# Patient Record
Sex: Female | Born: 1940 | Race: White | Hispanic: No | Smoking: Former smoker
Health system: Southern US, Community
[De-identification: ages and names within clinical notes are randomized; demographics above are authoritative.]

## PROBLEM LIST (undated history)

## (undated) DIAGNOSIS — Z87442 Personal history of urinary calculi: Secondary | ICD-10-CM

## (undated) DIAGNOSIS — M1990 Unspecified osteoarthritis, unspecified site: Secondary | ICD-10-CM

## (undated) DIAGNOSIS — K635 Polyp of colon: Secondary | ICD-10-CM

## (undated) DIAGNOSIS — K219 Gastro-esophageal reflux disease without esophagitis: Secondary | ICD-10-CM

## (undated) DIAGNOSIS — R202 Paresthesia of skin: Secondary | ICD-10-CM

## (undated) DIAGNOSIS — E079 Disorder of thyroid, unspecified: Secondary | ICD-10-CM

## (undated) DIAGNOSIS — C801 Malignant (primary) neoplasm, unspecified: Secondary | ICD-10-CM

## (undated) DIAGNOSIS — M5030 Other cervical disc degeneration, unspecified cervical region: Secondary | ICD-10-CM

## (undated) DIAGNOSIS — T148XXA Other injury of unspecified body region, initial encounter: Secondary | ICD-10-CM

## (undated) DIAGNOSIS — E039 Hypothyroidism, unspecified: Secondary | ICD-10-CM

## (undated) DIAGNOSIS — M479 Spondylosis, unspecified: Secondary | ICD-10-CM

## (undated) DIAGNOSIS — K5730 Diverticulosis of large intestine without perforation or abscess without bleeding: Secondary | ICD-10-CM

## (undated) DIAGNOSIS — K2970 Gastritis, unspecified, without bleeding: Secondary | ICD-10-CM

## (undated) DIAGNOSIS — R609 Edema, unspecified: Secondary | ICD-10-CM

## (undated) DIAGNOSIS — K589 Irritable bowel syndrome without diarrhea: Secondary | ICD-10-CM

## (undated) HISTORY — DX: Gastro-esophageal reflux disease without esophagitis: K21.9

## (undated) HISTORY — DX: Polyp of colon: K63.5

## (undated) HISTORY — PX: CATARACT EXTRACTION, BILATERAL: SHX1313

## (undated) HISTORY — DX: Gastritis, unspecified, without bleeding: K29.70

## (undated) HISTORY — DX: Other injury of unspecified body region, initial encounter: T14.8XXA

## (undated) HISTORY — PX: FOOT SURGERY: SHX648

## (undated) HISTORY — DX: Disorder of thyroid, unspecified: E07.9

## (undated) HISTORY — DX: Unspecified osteoarthritis, unspecified site: M19.90

## (undated) HISTORY — PX: TONSILLECTOMY: SUR1361

## (undated) HISTORY — DX: Paresthesia of skin: R20.2

## (undated) HISTORY — PX: COLONOSCOPY: SHX174

## (undated) HISTORY — DX: Malignant (primary) neoplasm, unspecified: C80.1

## (undated) HISTORY — DX: Irritable bowel syndrome, unspecified: K58.9

---

## 1966-04-20 DIAGNOSIS — C801 Malignant (primary) neoplasm, unspecified: Secondary | ICD-10-CM

## 1966-04-20 HISTORY — DX: Malignant (primary) neoplasm, unspecified: C80.1

## 1966-04-20 HISTORY — PX: MELANOMA EXCISION: SHX5266

## 1977-04-20 HISTORY — PX: ABDOMINAL HYSTERECTOMY: SHX81

## 1998-09-02 ENCOUNTER — Other Ambulatory Visit: Admission: RE | Admit: 1998-09-02 | Discharge: 1998-09-02 | Payer: Self-pay | Admitting: *Deleted

## 1999-01-23 ENCOUNTER — Other Ambulatory Visit: Admission: RE | Admit: 1999-01-23 | Discharge: 1999-01-23 | Payer: Self-pay | Admitting: Gastroenterology

## 1999-09-12 ENCOUNTER — Other Ambulatory Visit: Admission: RE | Admit: 1999-09-12 | Discharge: 1999-09-12 | Payer: Self-pay | Admitting: *Deleted

## 1999-09-29 ENCOUNTER — Encounter: Payer: Self-pay | Admitting: *Deleted

## 1999-09-29 ENCOUNTER — Encounter: Admission: RE | Admit: 1999-09-29 | Discharge: 1999-09-29 | Payer: Self-pay | Admitting: *Deleted

## 1999-10-02 ENCOUNTER — Encounter: Payer: Self-pay | Admitting: *Deleted

## 1999-10-02 ENCOUNTER — Encounter: Admission: RE | Admit: 1999-10-02 | Discharge: 1999-10-02 | Payer: Self-pay | Admitting: *Deleted

## 2000-11-10 ENCOUNTER — Other Ambulatory Visit: Admission: RE | Admit: 2000-11-10 | Discharge: 2000-11-10 | Payer: Self-pay | Admitting: *Deleted

## 2000-11-17 ENCOUNTER — Encounter: Payer: Self-pay | Admitting: *Deleted

## 2000-11-17 ENCOUNTER — Encounter: Admission: RE | Admit: 2000-11-17 | Discharge: 2000-11-17 | Payer: Self-pay | Admitting: *Deleted

## 2001-11-23 ENCOUNTER — Encounter: Admission: RE | Admit: 2001-11-23 | Discharge: 2001-11-23 | Payer: Self-pay | Admitting: *Deleted

## 2001-11-23 ENCOUNTER — Encounter: Payer: Self-pay | Admitting: *Deleted

## 2002-12-13 ENCOUNTER — Other Ambulatory Visit: Admission: RE | Admit: 2002-12-13 | Discharge: 2002-12-13 | Payer: Self-pay | Admitting: Family Medicine

## 2002-12-20 ENCOUNTER — Encounter: Payer: Self-pay | Admitting: Family Medicine

## 2002-12-20 ENCOUNTER — Encounter: Admission: RE | Admit: 2002-12-20 | Discharge: 2002-12-20 | Payer: Self-pay | Admitting: Family Medicine

## 2004-02-12 ENCOUNTER — Other Ambulatory Visit: Admission: RE | Admit: 2004-02-12 | Discharge: 2004-02-12 | Payer: Self-pay | Admitting: Family Medicine

## 2004-02-13 ENCOUNTER — Ambulatory Visit (HOSPITAL_COMMUNITY): Admission: RE | Admit: 2004-02-13 | Discharge: 2004-02-13 | Payer: Self-pay | Admitting: Family Medicine

## 2005-02-12 ENCOUNTER — Other Ambulatory Visit: Admission: RE | Admit: 2005-02-12 | Discharge: 2005-02-12 | Payer: Self-pay | Admitting: Family Medicine

## 2005-03-10 ENCOUNTER — Ambulatory Visit (HOSPITAL_COMMUNITY): Admission: RE | Admit: 2005-03-10 | Discharge: 2005-03-10 | Payer: Self-pay | Admitting: Family Medicine

## 2005-04-20 HISTORY — PX: CERVICAL SPINE SURGERY: SHX589

## 2005-05-24 ENCOUNTER — Encounter: Admission: RE | Admit: 2005-05-24 | Discharge: 2005-05-24 | Payer: Self-pay | Admitting: Orthopaedic Surgery

## 2005-07-13 ENCOUNTER — Ambulatory Visit (HOSPITAL_COMMUNITY): Admission: RE | Admit: 2005-07-13 | Discharge: 2005-07-14 | Payer: Self-pay | Admitting: Neurosurgery

## 2006-03-15 ENCOUNTER — Ambulatory Visit (HOSPITAL_COMMUNITY): Admission: RE | Admit: 2006-03-15 | Discharge: 2006-03-15 | Payer: Self-pay | Admitting: Family Medicine

## 2006-03-18 ENCOUNTER — Other Ambulatory Visit: Admission: RE | Admit: 2006-03-18 | Discharge: 2006-03-18 | Payer: Self-pay | Admitting: Family Medicine

## 2006-08-03 ENCOUNTER — Ambulatory Visit: Payer: Self-pay | Admitting: Internal Medicine

## 2006-08-16 ENCOUNTER — Ambulatory Visit: Payer: Self-pay | Admitting: Internal Medicine

## 2006-09-14 ENCOUNTER — Ambulatory Visit (HOSPITAL_BASED_OUTPATIENT_CLINIC_OR_DEPARTMENT_OTHER): Admission: RE | Admit: 2006-09-14 | Discharge: 2006-09-15 | Payer: Self-pay | Admitting: Orthopedic Surgery

## 2007-03-21 ENCOUNTER — Ambulatory Visit (HOSPITAL_COMMUNITY): Admission: RE | Admit: 2007-03-21 | Discharge: 2007-03-21 | Payer: Self-pay | Admitting: Family Medicine

## 2007-03-24 ENCOUNTER — Encounter: Admission: RE | Admit: 2007-03-24 | Discharge: 2007-03-24 | Payer: Self-pay | Admitting: Family Medicine

## 2007-05-23 ENCOUNTER — Other Ambulatory Visit: Admission: RE | Admit: 2007-05-23 | Discharge: 2007-05-23 | Payer: Self-pay | Admitting: Family Medicine

## 2008-03-21 ENCOUNTER — Ambulatory Visit (HOSPITAL_COMMUNITY): Admission: RE | Admit: 2008-03-21 | Discharge: 2008-03-21 | Payer: Self-pay | Admitting: Family Medicine

## 2009-03-29 ENCOUNTER — Encounter: Admission: RE | Admit: 2009-03-29 | Discharge: 2009-03-29 | Payer: Self-pay | Admitting: Neurology

## 2009-03-29 DIAGNOSIS — M5030 Other cervical disc degeneration, unspecified cervical region: Secondary | ICD-10-CM

## 2009-03-29 DIAGNOSIS — M479 Spondylosis, unspecified: Secondary | ICD-10-CM

## 2009-03-29 HISTORY — DX: Other cervical disc degeneration, unspecified cervical region: M50.30

## 2009-03-29 HISTORY — DX: Spondylosis, unspecified: M47.9

## 2009-04-03 ENCOUNTER — Encounter: Admission: RE | Admit: 2009-04-03 | Discharge: 2009-04-03 | Payer: Self-pay | Admitting: Neurology

## 2009-04-20 HISTORY — PX: THYROIDECTOMY, PARTIAL: SHX18

## 2009-05-01 ENCOUNTER — Ambulatory Visit (HOSPITAL_COMMUNITY): Admission: RE | Admit: 2009-05-01 | Discharge: 2009-05-01 | Payer: Self-pay | Admitting: Family Medicine

## 2009-05-27 ENCOUNTER — Other Ambulatory Visit: Admission: RE | Admit: 2009-05-27 | Discharge: 2009-05-27 | Payer: Self-pay | Admitting: Family Medicine

## 2009-09-12 ENCOUNTER — Encounter: Admission: RE | Admit: 2009-09-12 | Discharge: 2009-09-12 | Payer: Self-pay | Admitting: Family Medicine

## 2009-09-19 ENCOUNTER — Encounter: Admission: RE | Admit: 2009-09-19 | Discharge: 2009-09-19 | Payer: Self-pay | Admitting: Family Medicine

## 2009-09-19 ENCOUNTER — Other Ambulatory Visit: Admission: RE | Admit: 2009-09-19 | Discharge: 2009-09-19 | Payer: Self-pay | Admitting: Interventional Radiology

## 2009-12-16 ENCOUNTER — Ambulatory Visit (HOSPITAL_COMMUNITY): Admission: RE | Admit: 2009-12-16 | Discharge: 2009-12-17 | Payer: Self-pay | Admitting: Surgery

## 2009-12-16 ENCOUNTER — Encounter (INDEPENDENT_AMBULATORY_CARE_PROVIDER_SITE_OTHER): Payer: Self-pay | Admitting: Surgery

## 2010-05-05 ENCOUNTER — Ambulatory Visit (HOSPITAL_COMMUNITY)
Admission: RE | Admit: 2010-05-05 | Discharge: 2010-05-05 | Payer: Self-pay | Source: Home / Self Care | Attending: Family Medicine | Admitting: Family Medicine

## 2010-05-11 ENCOUNTER — Encounter: Payer: Self-pay | Admitting: Family Medicine

## 2010-07-04 LAB — DIFFERENTIAL
Eosinophils Absolute: 0.1 10*3/uL (ref 0.0–0.7)
Lymphocytes Relative: 35 % (ref 12–46)
Lymphs Abs: 2 10*3/uL (ref 0.7–4.0)
Neutro Abs: 3.2 10*3/uL (ref 1.7–7.7)
Neutrophils Relative %: 56 % (ref 43–77)

## 2010-07-04 LAB — COMPREHENSIVE METABOLIC PANEL
BUN: 10 mg/dL (ref 6–23)
CO2: 31 mEq/L (ref 19–32)
Calcium: 9.2 mg/dL (ref 8.4–10.5)
Creatinine, Ser: 0.76 mg/dL (ref 0.4–1.2)
GFR calc non Af Amer: >60 mL/min (ref >60)
Glucose, Bld: 88 mg/dL (ref 70–99)

## 2010-07-04 LAB — SURGICAL PCR SCREEN: MRSA, PCR: NEGATIVE

## 2010-07-04 LAB — URINALYSIS, ROUTINE W REFLEX MICROSCOPIC
Bilirubin Urine: NEGATIVE
Nitrite: NEGATIVE
Specific Gravity, Urine: 1.012 (ref 1.005–1.030)
Urobilinogen, UA: 1 mg/dL (ref 0.0–1.0)

## 2010-07-04 LAB — CBC
HCT: 45.1 % (ref 36.0–46.0)
Hemoglobin: 15.3 g/dL — ABNORMAL HIGH (ref 12.0–15.0)
MCH: 31.9 pg (ref 26.0–34.0)
MCHC: 34 g/dL (ref 30.0–36.0)
RDW: 12.2 % (ref 11.5–15.5)

## 2010-07-04 LAB — PROTIME-INR
INR: 0.99 (ref 0.00–1.49)
Prothrombin Time: 13.3 seconds (ref 11.6–15.2)

## 2010-09-05 NOTE — Op Note (Signed)
NAME:  LINNAE, RASOOL             ACCOUNT NO.:  [id number]   MEDICAL RECORD NO.:  [id number]          PATIENT TYPE:  OIB   LOCATION:  3011                         FACILITY:  [facility]   PHYSICIAN:  Hewitt Shorts, M.D.DATE OF BIRTH:  1941/04/05   DATE OF PROCEDURE:  07/13/2005  DATE OF DISCHARGE:  07/14/2005                                 OPERATIVE REPORT   PREOPERATIVE DIAGNOSIS:  Left C7-T1 cervical disk herniation, cervical  spondylosis, cervical degenerative disk disease, and cervical radiculopathy.   POSTOPERATIVE DIAGNOSIS:  Left C7-T1 cervical disk herniation, cervical  spondylosis, cervical degenerative disk disease, and cervical radiculopathy.   OPERATION PERFORMED:  Left C7-T1 posterior cervical laminotomy, foraminotomy  and microdiskectomy with microdissection.   SURGEON:  Hewitt Shorts, M.D.   ASSISTANT:  Danae Orleans. Venetia Maxon, M.D.   ANESTHESIA:  General endotracheal.   INDICATIONS FOR PROCEDURE:  The patient is a 70 year old woman who presented  with a left C8 cervical radiculopathy with weakness of the intrinsics and  grip of the left hand and loss of sensation of the fourth and fifth digits  of the left hand.  MRI scan revealed multilevel degenerative disk disease  and spondylosis involving C5-6, C6-7 and C7-T1 but included with this was a  soft left C7-T1 cervical disk herniation and decision was therefore made to  proceed with posterior cervical diskectomy.   DESCRIPTION OF PROCEDURE:  The patient was brought to the operating room and  placed under general endotracheal anesthesia.  A three-pin Mayfield  headholder was applied.  The patient was brought to a sitting position.  The  posterior aspect of the neck and upper back were prepped with Betadine soap  and solution and draped in sterile fashion.  The midline was infiltrated  with local anesthetic and epinephrine.  Midline incision was made over the  C7-T1 level and dissection was carried down through the  subcutaneous tissue.  Bipolar cautery and electrocautery were used to maintain hemostasis.  Dissection was carried down to the cervical fascia which was incised to the  left side of midline and the paracervical musculature was dissected from the  spinous process and lamina in a subperiosteal fashion.  A localizing x-ray  was taken and the C7-T1 interlaminar space was identified.  Microscope was  draped and brought into the field to provide additional magnification,  illumination and visualization and the remainder of the decompression was  performed using microdissection and microsurgical technique.  A left C7-T1  laminotomy and medial facetectomy and foraminotomy was performed using the X-  Max drill and 2 mm Kerrison punches with thin foot plate.  Ligamentum flavum  was carefully removed and we identified the lateral margin of the thecal sac  and the exiting left C8 nerve root.  We then exposed the axilla of the left  C8 nerve root and gently retracted the nerve root rostrally.  We found a  ventral foraminal disk herniation and incised the remaining ligamentous  fibers over it and a fragment extruded.  We were able to remove the entire  disk herniation in two large fragments and were thereby able to decompress  the left C8 nerve.  We then examined the epidural space to ensure that no  further fragments remained and that good decompression had been achieved of  the thecal sac and nerve root and once it had been confirmed, the wound was  irrigated with bacitracin solution, checked for hemostasis which was  established with the use of some Gelfoam soaked in Thrombin. We did remove  the Gelfoam, though, prior to closure.  Then, once hemostasis was  established, we proceeded with closure.  The deep fascia closed with  interrupted undyed 2-0 Vicryl sutures, the Scarpa's fascia closed with  interrupted inverted 2-0 undyed Vicryl sutures and the subcutaneous and  subcuticular layer were closed  with interrupted inverted 3-0 undyed Vicryl  sutures and the skin edges were approximated with DermaBond.  The procedure  was tolerated well.  The estimated blood loss was less than 50 mL.  Sponge,  needle and instrument counts were correct.  Following surgery, the patient  was brought back to a supine position, the three-pin Mayfield headholder was  removed and she was then to be reversed from anesthetic, extubated and  transferred to the recovery room for further care.      Hewitt Shorts, M.D.  Electronically Signed     RWN/MEDQ  D:  07/13/2005  T:  07/15/2005  Job:  [id number]

## 2010-09-05 NOTE — Op Note (Signed)
NAME:  Morgan Gregory, Morgan Gregory NO.:  [id number]   MEDICAL RECORD NO.:  [id number]          PATIENT TYPE:  AMB   LOCATION:  DSC                          FACILITY:  [facility]   PHYSICIAN:  Leonides Grills, M.D.     DATE OF BIRTH:  [birth year]   DATE OF PROCEDURE:  09/14/2006  DATE OF DISCHARGE:                               OPERATIVE REPORT   PREOPERATIVE DIAGNOSES:  1. Left hypermobile first ray.  2. Left hallux valgus.  3. Left second metatarsalgia.  4. Left second hammertoe.   POSTOPERATIVE DIAGNOSES:  1. Left hypermobile first ray.  2. Left hallux valgus.  3. Left second metatarsalgia.  4. Left second hammertoe.   OPERATION:  1. Left first TMT joint fusion with osteotomy.  2. Left local bone graft.  3. Left modified McBride bunionectomy.  4. Left second Weil metatarsal shortening osteotomy.  5. Left second toe metatarsophalangeal joint dorsal capsulotomy with      collateral release.  6. Stress x-rays, left foot.   ANESTHESIA:  General with block.   SURGEON:  Leonides Grills, M.D.   ASSISTANT:  Evlyn Kanner, P.A.   ESTIMATED BLOOD LOSS:  Minimal.   TOURNIQUET TIME:  Approximately 1 hour, 20 minutes.   COMPLICATIONS:  None.   DISPOSITION:  Stable to PR.   INDICATIONS:  This is a 70 year old female who has had a previous left  bunionectomy that has failed and she has had persistent pain and  worsening deformity.  She has also developed callus and pain underneath  her second metatarsal head that was interfering with her life despite  several different types of pad shoe wear and orthotic modification.  She  was counseled about the above procedure.  All risks which include  infection, nerve or vessel injury, nonunion, malunion, hardware  rotation, hardware failure, persistent pain or worse, prolonged  recovery, stiffness, arthritis, recurrence deformity, hallux varus were  all explained and cock-up toe deformity were all explained.  Questions  were encouraged  and answered.   OPERATION:  The patient was brought to the operating, placed in the  supine position.  After adequate general endotracheal tube anesthesia  with block was administered as well as Ancef 1 gram IV piggyback, the  left lower extremity was then prepped and draped in sterile manner.  With proximally based thigh tourniquet, the limb was gravity  exsanguinated and tourniquet was elevated to 290 mmHg.  A  longitudinal  incision on the dorsal aspect of the left first TMT joint was then made.  Dissection was carried down through skin.  Hemostasis was obtained.  There was scar tissue in this area from a previous surgery.  We then  performed an interval between the EHL and EHB.  Dissection was carried  down directly to bone.  Both tendons were protected and their respective  sheaths were not violated.  Soft tissue and periosteum were elevated off  the base of the first metatarsal as well as the first TMT joint, medial  cuneiform.  The first TMT joint was then entered.  An osteotomy closing  wedge type was then made removing a distal lateral  wedge of the medial  cuneiform.  Once this was removed, we then trimmed the edges of the base  of first metatarsal, removed the remaining cartilage from the base of  first metatarsal as well.  Multiple 2-mm drill holes were placed on  either side of the joint as well.  Once this was done, a notch was  created at the base of the first metatarsal.  The first TMT joint was  then reduced with two-point reduction clamp.  We then drilled with a 3.5  followed by 2.5 mm drill hole within the notch and placed a 3.5 mm, 40  mm long cortical screw.  There was excellent purchase and compression  across the fusion site.  We then placed another screw from the medial  cuneiform into the base of the first metatarsal as well.  This had  excellent purchase and maintenance of the correction.  Prior to this,  the lateral edge of the base of first metatarsal was also  removed as  well and placed on the back table as local bone graft as well as the  wedge that was removed from the base of the distal aspect of the medial  cuneiform as well.  We extended the incision distally and performed a  lateral capsular release.  We also made another incision medially  midline over the great toe MTP joint.  Dissection was carried down  through skin.  Hemostasis was obtained.  Neurovascular structures were  identified both superiorly and inferiorly.  There was a lot of scar  tissue in this area as well.  An L-shaped capsulotomy was then made.  The scar tissue was freed up and a portion of the lateral capsule was  also released from within the joint with a curved Beaver blade.  This  had excellent release of the tight capsule.  We were able to reduce the  toe beautifully as well.  Sesamoids however, we had to be careful  because if we over corrected because of her previous Aiken osteotomy, it  would have caused her to have a hallux varus.  We irrigated the joint  with normal saline and then tightened the capsule with the toe held in  reduced position and reconstructed it medially with a 2-0 Vicryl stitch.  This had an Conservation officer, historic buildings.  Final stress x-rays were obtained in  the AP and lateral planes and this showed no gross motion across the  fusion site, fixation and proposition, excellent alignment as well.  We  then extended the incision distally over the second toe over the second  toe MTP joint.  Dissection was carried down through the skin.  Hemostasis was obtained.  The EDB EDL tendons were scarred  in and we  could not demarcate the tendons.  For this reason, I did not do the  transfer and it seemed like they had adequate tension on them at that  time as well.  I had performed the MTP joint dorsal capsulotomy with  collateral release.  This freed up the majority of the scar tissue in this area.  We were able to plantar flex the toe adequately as well.  We   then with stacked sagittal saw blades performed a Weil cup metatarsal  shortening osteotomy parallel to the plantar aspect of the foot.  The  head was then translated approximately 4 mm proximally and then affixed  with a 1.5-mm fully threaded cortical set screw using 1.1-mm drill hole  respectively.  This had excellent purchase and  maintenance of the  correction.  Once this screw had excellent purchase and maintenance of  the correction, we then trimmed the dorsal bridge of the metatarsal and  rounded it off nicely.  We then applied bone wax after this area was  copiously irrigated with normal saline.  The toe was in excellent  alignment.  There was no adverse location of the toe, i.e., cock-up or  varus valgus deformity.  Again, all areas were copiously irrigated with  normal saline.  Stress strain relieving local bone graft was applied to  the first TMT joint using a bur as well.  After the area was copiously  irrigated with normal saline, tourniquet was deflated.  Hemostasis was  obtained.  There was no pulsatile bleeding.  Subcu was closed with 3-0  Vicryl.  The skin was closed with 4-0 nylon over all wounds.  Sterile  dressing was applied.  A modified Jones dressing was applied.  The  patient was stable to the PR.   POSTOPERATIVE COURSE:  The patient will follow up in 2 weeks.  At that  time, we will remove the Jones dressing as well as suture.  She will  then go into short-leg nonweightbearing cast which she will wear for 6  weeks.  At 2 months postop, we will remove the cast, obtain x-rays and  then start her on therapy for active and passive range of motion  exercises for the ankle, subtalar and MTP joints.  She will go into a  Cam walker boot and weight bear as tolerated for 1 month.  At the end of  3 months, she will go into a normal shoe.  At 2 weeks postop, she will  receive a bunion pad placed between the first and second toe, silicone  type dumbbell-shape.  She will also  start plantar flexion stretching  exercises of her second MTP joint at 2 weeks postop as well immediately  in the cast as well as skin mobilization over the second toe as well.  Elevation and active range of motion of the toes was encouraged.      Leonides Grills, M.D.  Electronically Signed     PB/MEDQ  D:  09/14/2006  T:  09/14/2006  Job:  [id number]   cc:   Gretta Arab. Valentina Lucks, M.D.  Rocco Serene, M.D.

## 2011-04-24 ENCOUNTER — Other Ambulatory Visit (HOSPITAL_COMMUNITY): Payer: Self-pay | Admitting: Family Medicine

## 2011-04-24 DIAGNOSIS — Z1231 Encounter for screening mammogram for malignant neoplasm of breast: Secondary | ICD-10-CM

## 2011-05-21 ENCOUNTER — Ambulatory Visit (HOSPITAL_COMMUNITY)
Admission: RE | Admit: 2011-05-21 | Discharge: 2011-05-21 | Disposition: A | Discharge status: Home / Self Care | Payer: Managed Care, Other (non HMO) | Source: Ambulatory Visit | Attending: Family Medicine | Admitting: Family Medicine

## 2011-05-21 DIAGNOSIS — Z1231 Encounter for screening mammogram for malignant neoplasm of breast: Secondary | ICD-10-CM

## 2011-08-14 ENCOUNTER — Encounter: Payer: Self-pay | Admitting: Internal Medicine

## 2011-08-20 ENCOUNTER — Encounter: Payer: Self-pay | Admitting: Internal Medicine

## 2011-09-18 ENCOUNTER — Other Ambulatory Visit: Payer: Self-pay | Admitting: Dermatology

## 2011-10-19 ENCOUNTER — Ambulatory Visit (AMBULATORY_SURGERY_CENTER): Payer: Managed Care, Other (non HMO) | Admitting: *Deleted

## 2011-10-19 VITALS — Ht 65.5 in | Wt 134.0 lb

## 2011-10-19 DIAGNOSIS — Z1211 Encounter for screening for malignant neoplasm of colon: Secondary | ICD-10-CM

## 2011-10-19 MED ORDER — MOVIPREP 100 G PO SOLR: ORAL | Status: DC

## 2011-11-02 ENCOUNTER — Ambulatory Visit (AMBULATORY_SURGERY_CENTER): Payer: Managed Care, Other (non HMO) | Admitting: Internal Medicine

## 2011-11-02 ENCOUNTER — Encounter: Payer: Self-pay | Admitting: Internal Medicine

## 2011-11-02 VITALS — BP 144/79 | HR 72 | Temp 97.5°F | Resp 16 | Ht 65.0 in | Wt 134.0 lb

## 2011-11-02 DIAGNOSIS — Z860100 Personal history of colon polyps, unspecified: Secondary | ICD-10-CM

## 2011-11-02 DIAGNOSIS — Z1211 Encounter for screening for malignant neoplasm of colon: Secondary | ICD-10-CM

## 2011-11-02 DIAGNOSIS — Z800 Family history of malignant neoplasm of digestive organs: Secondary | ICD-10-CM

## 2011-11-02 MED ORDER — SODIUM CHLORIDE 0.9 % IV SOLN: 500.0000 mL | INTRAVENOUS | Status: DC

## 2011-11-02 NOTE — Op Note (Signed)
[facility] [address] [place], [place]  [address]  COLONOSCOPY PROCEDURE REPORT  PATIENT:  Morgan, Gregory  MR#:  [id number] BIRTHDATE:  May 31, 1940, 70 yrs. old  GENDER:  female ENDOSCOPIST:  Wilhemina Bonito. Eda Keys, MD REF. BY:  Surveillance Program Recall, PROCEDURE DATE:  11/02/2011 PROCEDURE:  Surveillance Colonoscopy ASA CLASS:  Class II INDICATIONS:  history of pre-cancerous (adenomatous) colon polyps, surveillance and high-risk screening, family history of colon cancer ; parent @ 33.multiple prior exams. last 07-2006 w/ TA MEDICATIONS:   MAC sedation, administered by CRNA, propofol (Diprivan) 420 mg IV  DESCRIPTION OF PROCEDURE:   After the risks benefits and alternatives of the procedure were thoroughly explained, informed consent was obtained.  Digital rectal exam was performed and revealed no abnormalities.   The LB CF-Q180AL [id number] endoscope was introduced through the anus and advanced to the cecum, which was identified by both the appendix and ileocecal valve, without limitations.  The quality of the prep was excellent, using MoviPrep.  The instrument was then slowly withdrawn as the colon was fully examined. <<PROCEDUREIMAGES>>  FINDINGS:  Mild diverticulosis was found in the sigmoid colon. Otherwise normal colonoscopy without  polyps, masses, vascular ectasias, or inflammatory changes.   Retroflexed views in the rectum revealed internal hemorrhoids.  The time to cecum =  7:50 minutes. The scope was then withdrawn in  11:20  minutes from the cecum and the procedure completed.  COMPLICATIONS:  None  ENDOSCOPIC IMPRESSION: 1) Mild diverticulosis in the sigmoid colon 2) Otherwise normal colonoscopy 3) Internal hemorrhoids  RECOMMENDATIONS: 1) Follow up colonoscopy in 5 years  ______________________________ Wilhemina Bonito. Eda Keys, MD  CC:  Maurice Small, MD;  The Patient  n. eSIGNED:   Wilhemina Bonito. Eda Keys at 11/02/2011 10:41 AM  Docia Barrier,  [phone]

## 2011-11-02 NOTE — Progress Notes (Signed)
Patient did not have preoperative order for IV antibiotic SSI prophylaxis. (G8918)  Patient did not experience any of the following events: a burn prior to discharge; a fall within the facility; wrong site/side/patient/procedure/implant event; or a hospital transfer or hospital admission upon discharge from the facility. (G8907)  

## 2011-11-02 NOTE — Patient Instructions (Addendum)
YOU HAD AN ENDOSCOPIC PROCEDURE TODAY AT THE [facility]: Refer to the procedure report that was given to you for any specific questions about what was found during the examination.  If the procedure report does not answer your questions, please call your gastroenterologist to clarify.  If you requested that your care partner not be given the details of your procedure findings, then the procedure report has been included in a sealed envelope for you to review at your convenience later.  YOU SHOULD EXPECT: Some feelings of bloating in the abdomen. Passage of more gas than usual.  Walking can help get rid of the air that was put into your GI tract during the procedure and reduce the bloating. If you had a lower endoscopy (such as a colonoscopy or flexible sigmoidoscopy) you may notice spotting of blood in your stool or on the toilet paper. If you underwent a bowel prep for your procedure, then you may not have a normal bowel movement for a few days.  DIET: Your first meal following the procedure should be a light meal and then it is ok to progress to your normal diet.  A half-sandwich or bowl of soup is an example of a good first meal.  Heavy or fried foods are harder to digest and may make you feel nauseous or bloated.  Likewise meals heavy in dairy and vegetables can cause extra gas to form and this can also increase the bloating.  Drink plenty of fluids but you should avoid alcoholic beverages for 24 hours.  ACTIVITY: Your care partner should take you home directly after the procedure.  You should plan to take it easy, moving slowly for the rest of the day.  You can resume normal activity the day after the procedure however you should NOT DRIVE or use heavy machinery for 24 hours (because of the sedation medicines used during the test).    SYMPTOMS TO REPORT IMMEDIATELY: A gastroenterologist can be reached at any hour.  During normal business hours, 8:30 AM to 5:00 PM Monday through Friday,  call [phone].  After hours and on weekends, please call the GI answering service at [phone] who will take a message and have the physician on call contact you.   Following lower endoscopy (colonoscopy or flexible sigmoidoscopy):  Excessive amounts of blood in the stool  Significant tenderness or worsening of abdominal pains  Swelling of the abdomen that is new, acute  Fever of 100F or higher    FOLLOW UP: If any biopsies were taken you will be contacted by phone or by letter within the next 1-3 weeks.  Call your gastroenterologist if you have not heard about the biopsies in 3 weeks.  Our staff will call the home number listed on your records the next business day following your procedure to check on you and address any questions or concerns that you may have at that time regarding the information given to you following your procedure. This is a courtesy call and so if there is no answer at the home number and we have not heard from you through the emergency physician on call, we will assume that you have returned to your regular daily activities without incident.  SIGNATURES/CONFIDENTIALITY: You and/or your care partner have signed paperwork which will be entered into your electronic medical record.  These signatures attest to the fact that that the information above on your After Visit Summary has been reviewed and is understood.  Full responsibility of the confidentiality   of this discharge information lies with you and/or your care-partner.     

## 2011-11-03 ENCOUNTER — Telehealth: Payer: Self-pay | Admitting: *Deleted

## 2011-11-03 NOTE — Telephone Encounter (Signed)
  Follow up Call-  Call back number 11/02/2011  Post procedure Call Back phone  # [phone]  Permission to leave phone message Yes     Patient questions:  Do you have a fever, pain , or abdominal swelling? no Pain Score  0 *  Have you tolerated food without any problems? yes  Have you been able to return to your normal activities? yes  Do you have any questions about your discharge instructions: Diet   no Medications  no Follow up visit  no  Do you have questions or concerns about your Care? no  Actions: * If pain score is 4 or above: No action needed, pain <4.

## 2012-04-25 ENCOUNTER — Other Ambulatory Visit (HOSPITAL_COMMUNITY): Payer: Self-pay | Admitting: Family Medicine

## 2012-04-25 DIAGNOSIS — Z1231 Encounter for screening mammogram for malignant neoplasm of breast: Secondary | ICD-10-CM

## 2012-05-23 ENCOUNTER — Ambulatory Visit (HOSPITAL_COMMUNITY): Admission: RE | Admit: 2012-05-23 | Payer: Managed Care, Other (non HMO) | Source: Ambulatory Visit

## 2012-05-27 ENCOUNTER — Ambulatory Visit (HOSPITAL_COMMUNITY)
Admission: RE | Admit: 2012-05-27 | Discharge: 2012-05-27 | Disposition: A | Discharge status: Home / Self Care | Payer: Managed Care, Other (non HMO) | Source: Ambulatory Visit | Attending: Family Medicine | Admitting: Family Medicine

## 2012-05-27 DIAGNOSIS — Z1231 Encounter for screening mammogram for malignant neoplasm of breast: Secondary | ICD-10-CM

## 2012-05-30 ENCOUNTER — Other Ambulatory Visit: Payer: Self-pay | Admitting: Family Medicine

## 2012-05-30 DIAGNOSIS — R928 Other abnormal and inconclusive findings on diagnostic imaging of breast: Secondary | ICD-10-CM

## 2012-06-08 ENCOUNTER — Ambulatory Visit
Admission: RE | Admit: 2012-06-08 | Discharge: 2012-06-08 | Disposition: A | Discharge status: Home / Self Care | Payer: Managed Care, Other (non HMO) | Source: Ambulatory Visit | Attending: Family Medicine | Admitting: Family Medicine

## 2012-06-08 DIAGNOSIS — R928 Other abnormal and inconclusive findings on diagnostic imaging of breast: Secondary | ICD-10-CM

## 2013-02-22 ENCOUNTER — Ambulatory Visit
Admission: RE | Admit: 2013-02-22 | Discharge: 2013-02-22 | Disposition: A | Discharge status: Home / Self Care | Payer: Managed Care, Other (non HMO) | Source: Ambulatory Visit | Attending: Family Medicine | Admitting: Family Medicine

## 2013-02-22 ENCOUNTER — Other Ambulatory Visit: Payer: Self-pay | Admitting: Family Medicine

## 2013-02-22 DIAGNOSIS — Q899 Congenital malformation, unspecified: Secondary | ICD-10-CM

## 2013-02-22 DIAGNOSIS — M2550 Pain in unspecified joint: Secondary | ICD-10-CM

## 2013-05-26 ENCOUNTER — Other Ambulatory Visit (HOSPITAL_COMMUNITY): Payer: Self-pay | Admitting: Family Medicine

## 2013-05-26 DIAGNOSIS — Z1231 Encounter for screening mammogram for malignant neoplasm of breast: Secondary | ICD-10-CM

## 2013-06-16 ENCOUNTER — Ambulatory Visit (HOSPITAL_COMMUNITY): Payer: Managed Care, Other (non HMO)

## 2013-07-05 ENCOUNTER — Ambulatory Visit (HOSPITAL_COMMUNITY)
Admission: RE | Admit: 2013-07-05 | Discharge: 2013-07-05 | Disposition: A | Discharge status: Home / Self Care | Payer: Medicare HMO | Source: Ambulatory Visit | Attending: Family Medicine | Admitting: Family Medicine

## 2013-07-05 DIAGNOSIS — Z1231 Encounter for screening mammogram for malignant neoplasm of breast: Secondary | ICD-10-CM | POA: Insufficient documentation

## 2013-11-17 ENCOUNTER — Other Ambulatory Visit: Payer: Self-pay | Admitting: Dermatology

## 2014-06-22 ENCOUNTER — Other Ambulatory Visit (HOSPITAL_COMMUNITY): Payer: Self-pay | Admitting: Family Medicine

## 2014-06-22 DIAGNOSIS — Z1231 Encounter for screening mammogram for malignant neoplasm of breast: Secondary | ICD-10-CM

## 2014-07-10 ENCOUNTER — Ambulatory Visit (HOSPITAL_COMMUNITY)
Admission: RE | Admit: 2014-07-10 | Discharge: 2014-07-10 | Disposition: A | Discharge status: Home / Self Care | Payer: Medicare Other | Source: Ambulatory Visit | Attending: Family Medicine | Admitting: Family Medicine

## 2014-07-10 DIAGNOSIS — Z1231 Encounter for screening mammogram for malignant neoplasm of breast: Secondary | ICD-10-CM | POA: Diagnosis present

## 2015-03-05 ENCOUNTER — Encounter (HOSPITAL_COMMUNITY): Payer: Self-pay

## 2015-03-05 ENCOUNTER — Encounter (HOSPITAL_COMMUNITY)
Admission: RE | Admit: 2015-03-05 | Discharge: 2015-03-05 | Disposition: A | Discharge status: Home / Self Care | Payer: Medicare Other | Source: Ambulatory Visit | Attending: Orthopedic Surgery | Admitting: Orthopedic Surgery

## 2015-03-05 DIAGNOSIS — Z01812 Encounter for preprocedural laboratory examination: Secondary | ICD-10-CM | POA: Diagnosis not present

## 2015-03-05 HISTORY — DX: Hypothyroidism, unspecified: E03.9

## 2015-03-05 HISTORY — DX: Edema, unspecified: R60.9

## 2015-03-05 LAB — BASIC METABOLIC PANEL
Anion gap: 7 (ref 5–15)
BUN: 15 mg/dL (ref 6–20)
CALCIUM: 9.5 mg/dL (ref 8.9–10.3)
CO2: 29 mmol/L (ref 22–32)
CREATININE: 0.74 mg/dL (ref 0.44–1.00)
Chloride: 104 mmol/L (ref 101–111)
GFR calc Af Amer: >60 mL/min (ref >60)
GLUCOSE: 99 mg/dL (ref 65–99)
Potassium: 5.2 mmol/L — ABNORMAL HIGH (ref 3.5–5.1)
Sodium: 140 mmol/L (ref 135–145)

## 2015-03-05 LAB — CBC
HCT: 46 % (ref 36.0–46.0)
HEMOGLOBIN: 15.4 g/dL — AB (ref 12.0–15.0)
MCH: 30.9 pg (ref 26.0–34.0)
MCHC: 33.5 g/dL (ref 30.0–36.0)
MCV: 92.4 fL (ref 78.0–100.0)
Platelets: 217 10*3/uL (ref 150–400)
RBC: 4.98 MIL/uL (ref 3.87–5.11)
RDW: 12.2 % (ref 11.5–15.5)
WBC: 7.6 10*3/uL (ref 4.0–10.5)

## 2015-03-05 LAB — URINALYSIS, ROUTINE W REFLEX MICROSCOPIC
Bilirubin Urine: NEGATIVE
Glucose, UA: NEGATIVE mg/dL
Hgb urine dipstick: NEGATIVE
KETONES UR: NEGATIVE mg/dL
LEUKOCYTES UA: NEGATIVE
Nitrite: NEGATIVE
PH: 6.5 (ref 5.0–8.0)
PROTEIN: NEGATIVE mg/dL
Specific Gravity, Urine: 1.018 (ref 1.005–1.030)

## 2015-03-05 LAB — PROTIME-INR
INR: 1.04 (ref 0.00–1.49)
PROTHROMBIN TIME: 13.8 s (ref 11.6–15.2)

## 2015-03-05 LAB — SURGICAL PCR SCREEN
MRSA, PCR: NEGATIVE
Staphylococcus aureus: NEGATIVE

## 2015-03-05 LAB — APTT: APTT: 31 s (ref 24–37)

## 2015-03-05 NOTE — Patient Instructions (Addendum)
[place]  03/05/2015   Your procedure is scheduled on:  11-29 -2016 Tuesday  Enter through [facility]  Entrance and follow signs to [facility] to [place]. Arrive at       1000 AM.  (Limit 1 person with you).  Call this number if you have problems the morning of surgery: [phone]  Or Presurgical Testing [phone] days before.   For Living Will and/or Health Care Power Attorney Forms: please provide copy for your medical record,may bring AM of surgery(Forms should be already notarized -we do not provide this service).(03-05-15 Yes, information provided  today and placed with chart).      Do not eat food/ or drink: After Midnight.  Exception: may have clear liquids:up to 6 Hours before arrival. Nothing after: 0600 AM  Clear liquids include soda, tea, black coffee, apple or grape juice, broth.  Take these medicines the morning of surgery with A SIP OF WATER-   (DO NOT TAKE ANY DIABETIC MEDS AM OF SURGERY) : Levothyroxine.   Do not wear jewelry, make-up or nail polish.  Do not wear deodorant, lotions, powders, or perfumes.   Do not shave legs and under arms- 48 hours(2 days) prior to first CHG shower.(Shaving face and neck okay.)  Do not bring valuables to the hospital.(Hospital is not responsible for lost valuables).  Contacts, dentures or removable bridgework, body piercing, hair pins may not be worn into surgery.  Leave suitcase in the car. After surgery it may be brought to your room.  For patients admitted to the hospital, checkout time is 11:00 AM the day of discharge.(Restricted visitors-Any Persons displaying flu-like symptoms or illness).    Patients discharged the day of surgery will not be allowed to drive home. Must have responsible person with you x 24 hours once discharged.  Name and phone number of your driver: Latechia Sperduto ,sister-in-law [phone]     Please read over the following fact sheets that you were given:   CHG(Chlorhexidine Gluconate 4% Surgical Soap) use, MRSA Information, Blood Transfusion fact sheet, Incentive Spirometry Instruction.  Remember : Type/Screen "Blue armbands" - may not be removed once applied(would result in being retested AM of surgery, if removed).         [facility] - Preparing for Surgery Before surgery, you can play an important role.  Because skin is not sterile, your skin needs to be as free of germs as possible.  You can reduce the number of germs on your skin by washing with CHG (chlorahexidine gluconate) soap before surgery.  CHG is an antiseptic cleaner which kills germs and bonds with the skin to continue killing germs even after washing. Please DO NOT use if you have an allergy to CHG or antibacterial soaps.  If your skin becomes reddened/irritated stop using the CHG and inform your nurse when you arrive at Short Stay. Do not shave (including legs and underarms) for at least 48 hours prior to the first CHG shower.  You may shave your face/neck. Please follow these instructions carefully:  1.  Shower with CHG Soap the night before surgery and the  morning of Surgery.  2.  If you choose to wash your hair, wash your hair first as usual with your  normal  shampoo.  3.  After you shampoo, rinse your hair and body thoroughly to remove the  shampoo.  4.  Use CHG as you would any other liquid soap.  You can apply chg directly  to the skin and wash                       Gently with a scrungie or clean washcloth.  5.  Apply the CHG Soap to your body ONLY FROM THE NECK DOWN.   Do not use on face/ open                           Wound or open sores. Avoid contact with eyes, ears mouth and genitals (private parts).                       Wash face,  Genitals (private parts) with your normal soap.             6.  Wash thoroughly, paying special attention to the area where your surgery  will be performed.  7.  Thoroughly rinse your body with warm water from  the neck down.  8.  DO NOT shower/wash with your normal soap after using and rinsing off  the CHG Soap.                9.  Pat yourself dry with a clean towel.            10.  Wear clean pajamas.            11.  Place clean sheets on your bed the night of your first shower and do not  sleep with pets. Day of Surgery : Do not apply any lotions/deodorants the morning of surgery.  Please wear clean clothes to the hospital/surgery center.  FAILURE TO FOLLOW THESE INSTRUCTIONS MAY RESULT IN THE CANCELLATION OF YOUR SURGERY PATIENT SIGNATURE_________________________________  NURSE SIGNATURE__________________________________  ________________________________________________________________________   Adam Phenix  An incentive spirometer is a tool that can help keep your lungs clear and active. This tool measures how well you are filling your lungs with each breath. Taking long deep breaths may help reverse or decrease the chance of developing breathing (pulmonary) problems (especially infection) following:  A long period of time when you are unable to move or be active. BEFORE THE PROCEDURE   If the spirometer includes an indicator to show your best effort, your nurse or respiratory therapist will set it to a desired goal.  If possible, sit up straight or lean slightly forward. Try not to slouch.  Hold the incentive spirometer in an upright position. INSTRUCTIONS FOR USE   Sit on the edge of your bed if possible, or sit up as far as you can in bed or on a chair.  Hold the incentive spirometer in an upright position.  Breathe out normally.  Place the mouthpiece in your mouth and seal your lips tightly around it.  Breathe in slowly and as deeply as possible, raising the piston or the ball toward the top of the column.  Hold your breath for 3-5 seconds or for as long as possible. Allow the piston or ball to fall to the bottom of the column.  Remove the mouthpiece from your  mouth and breathe out normally.  Rest for a few seconds and repeat Steps 1 through 7 at least 10 times every 1-2 hours when you are awake. Take your time and take a few normal breaths between deep breaths.  The spirometer may include an indicator to  show your best effort. Use the indicator as a goal to work toward during each repetition.  After each set of 10 deep breaths, practice coughing to be sure your lungs are clear. If you have an incision (the cut made at the time of surgery), support your incision when coughing by placing a pillow or rolled up towels firmly against it. Once you are able to get out of bed, walk around indoors and cough well. You may stop using the incentive spirometer when instructed by your caregiver.  RISKS AND COMPLICATIONS  Take your time so you do not get dizzy or light-headed.  If you are in pain, you may need to take or ask for pain medication before doing incentive spirometry. It is harder to take a deep breath if you are having pain. AFTER USE  Rest and breathe slowly and easily.  It can be helpful to keep track of a log of your progress. Your caregiver can provide you with a simple table to help with this. If you are using the spirometer at home, follow these instructions: [place] IF:   You are having difficultly using the spirometer.  You have trouble using the spirometer as often as instructed.  Your pain medication is not giving enough relief while using the spirometer.  You develop fever of 100.5 F (38.1 C) or higher. SEEK IMMEDIATE MEDICAL CARE IF:   You cough up bloody sputum that had not been present before.  You develop fever of 102 F (38.9 C) or greater.  You develop worsening pain at or near the incision site. MAKE SURE YOU:   Understand these instructions.  Will watch your condition.  Will get help right away if you are not doing well or get worse. Document Released: 08/17/2006 Document Revised: 06/29/2011 Document  Reviewed: 10/18/2006 ExitCare Patient Information 2014 [employer], [place].   ________________________________________________________________________  WHAT IS A BLOOD TRANSFUSION? Blood Transfusion Information  A transfusion is the replacement of blood or some of its parts. Blood is made up of multiple cells which provide different functions.  Red blood cells carry oxygen and are used for blood loss replacement.  White blood cells fight against infection.  Platelets control bleeding.  Plasma helps clot blood.  Other blood products are available for specialized needs, such as hemophilia or other clotting disorders. BEFORE THE TRANSFUSION  Who gives blood for transfusions?   Healthy volunteers who are fully evaluated to make sure their blood is safe. This is blood bank blood. Transfusion therapy is the safest it has ever been in the practice of medicine. Before blood is taken from a donor, a complete history is taken to make sure that person has no history of diseases nor engages in risky social behavior (examples are intravenous drug use or sexual activity with multiple partners). The donor's travel history is screened to minimize risk of transmitting infections, such as malaria. The donated blood is tested for signs of infectious diseases, such as HIV and hepatitis. The blood is then tested to be sure it is compatible with you in order to minimize the chance of a transfusion reaction. If you or a relative donates blood, this is often done in anticipation of surgery and is not appropriate for emergency situations. It takes many days to process the donated blood. RISKS AND COMPLICATIONS Although transfusion therapy is very safe and saves many lives, the main dangers of transfusion include:   Getting an infectious disease.  Developing a transfusion reaction. This is an allergic reaction to  something in the blood you were given. Every precaution is taken to prevent this. The decision to have a  blood transfusion has been considered carefully by your caregiver before blood is given. Blood is not given unless the benefits outweigh the risks. AFTER THE TRANSFUSION  Right after receiving a blood transfusion, you will usually feel much better and more energetic. This is especially true if your red blood cells have gotten low (anemic). The transfusion raises the level of the red blood cells which carry oxygen, and this usually causes an energy increase.  The nurse administering the transfusion will monitor you carefully for complications. HOME CARE INSTRUCTIONS  No special instructions are needed after a transfusion. You may find your energy is better. Speak with your caregiver about any limitations on activity for underlying diseases you may have. SEEK MEDICAL CARE IF:   Your condition is not improving after your transfusion.  You develop redness or irritation at the intravenous (IV) site. SEEK IMMEDIATE MEDICAL CARE IF:  Any of the following symptoms occur over the next 12 hours:  Shaking chills.  You have a temperature by mouth above 102 F (38.9 C), not controlled by medicine.  Chest, back, or muscle pain.  People around you feel you are not acting correctly or are confused.  Shortness of breath or difficulty breathing.  Dizziness and fainting.  You get a rash or develop hives.  You have a decrease in urine output.  Your urine turns a dark color or changes to pink, red, or brown. Any of the following symptoms occur over the next 10 days:  You have a temperature by mouth above 102 F (38.9 C), not controlled by medicine.  Shortness of breath.  Weakness after normal activity.  The white part of the eye turns yellow (jaundice).  You have a decrease in the amount of urine or are urinating less often.  Your urine turns a dark color or changes to pink, red, or brown. Document Released: 04/03/2000 Document Revised: 06/29/2011 Document Reviewed: 11/21/2007 [facility]  Patient Information 2014 [place], [place].  _______________________________________________________________________

## 2015-03-05 NOTE — H&P (Signed)
TOTAL KNEE ADMISSION H&P  Patient is being admitted for left total knee arthroplasty.  Subjective:  Chief Complaint:    Left knee primary OA / pain  HPI: Morgan Gregory, 74 y.o. female, has a history of pain and functional disability in the left knee due to arthritis and has failed non-surgical conservative treatments for greater than 12 weeks to include NSAID's and/or analgesics, corticosteriod injections, viscosupplementation injections and activity modification.  Onset of symptoms was gradual, starting 4+ years ago with gradually worsening course since that time. The patient noted no past surgery on the left knee(s).  Patient currently rates pain in the left knee(s) at 9 out of 10 with activity. Patient has worsening of pain with activity and weight bearing, pain that interferes with activities of daily living, pain with passive range of motion, crepitus and joint swelling.  Patient has evidence of periarticular osteophytes and joint space narrowing by imaging studies.  There is no active infection.  Risks, benefits and expectations were discussed with the patient.  Risks including but not limited to the risk of anesthesia, blood clots, nerve damage, blood vessel damage, failure of the prosthesis, infection and up to and including death.  Patient understand the risks, benefits and expectations and wishes to proceed with surgery.   PCP: Osborne Casco, MD  D/C Plans:      SNF  [facility])  Post-op Meds:       No Rx given   Tranexamic Acid:      To be given - IV   Decadron:      Is to be given  FYI:     ASA post-op  Norco post-op  Ambien 5 mg  (will need to order for [facility] as well)    Past Medical History  Diagnosis Date  . Colon polyps   . IBS (irritable bowel syndrome)   . Gastritis   . Arthritis   . Cancer ([place]) 1968    melanoma on right shin  . Osteoporosis     osteopenia  . Thyroid disease     hypothyroid  . Hypothyroidism   . GERD (gastroesophageal reflux  disease)     03-05-15 not an issue now  . 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 Edema     right lower leg/foot due to s/p melanoma excision    Past Surgical History  Procedure Laterality Date  . Melanoma excision  1968    right shin/calf- calf measures 0.75 inches larger than left  . Abdominal hysterectomy  1979  . Foot surgery Bilateral     hammertoe, neuromas, bunionectomy  . Thyroidectomy, partial  2011  . Cervical spine surgery  2007    posterior approach-no retained hardware  . Cataract extraction, bilateral      8'15,. 9'15  . Tonsillectomy      No prescriptions prior to admission   Allergies  Allergen Reactions  . Augmentin [Amoxicillin-Pot Clavulanate] Diarrhea and Nausea Only    Has patient had a PCN reaction causing immediate rash, facial/tongue/throat swelling, SOB or lightheadedness with hypotension: No Has patient had a PCN reaction causing severe rash involving mucus membranes or skin necrosis: No Has patient had a PCN reaction that required hospitalization No Has patient had a PCN reaction occurring within the last 10 years: No If all of the above answers are "NO", then may proceed with Cephalosporin use.   . Erythromycin Diarrhea and Nausea Only    Severe diarrhea  Social History  Substance Use Topics  . Smoking status: Former Smoker    Types: Cigarettes    Quit date: 03/04/1965  . Smokeless tobacco: Never Used  . Alcohol Use: 4.2 oz/week    7 Glasses of wine per week     Comment: wine 1 glass daily    Family History  Problem Relation Age of Onset  . Colon cancer Mother      Review of Systems  Constitutional: Negative.   HENT: Positive for tinnitus.   Eyes: Negative.   Respiratory: Negative.   Cardiovascular: Negative.   Gastrointestinal: Positive for heartburn.  Genitourinary: Positive for frequency.  Musculoskeletal: Positive for back pain and joint pain.  Skin: Negative.   Neurological:  Negative.   Endo/Heme/Allergies: Negative.   Psychiatric/Behavioral: The patient has insomnia.     Objective:  Physical Exam  Constitutional: She is oriented to person, place, and time. She appears well-developed and well-nourished.  HENT:  Head: Normocephalic and atraumatic.  Eyes: Pupils are equal, round, and reactive to light.  Neck: Neck supple. No JVD present. No tracheal deviation present. No thyromegaly present.  Cardiovascular: Normal rate, regular rhythm, normal heart sounds and intact distal pulses.   Respiratory: Effort normal and breath sounds normal. No stridor. No respiratory distress. She has no wheezes.  GI: Soft. There is no tenderness. There is no guarding.  Musculoskeletal:       Left knee: She exhibits decreased range of motion, swelling and bony tenderness. She exhibits no ecchymosis, no deformity, no laceration and no erythema. Tenderness found.  Lymphadenopathy:    She has no cervical adenopathy.  Neurological: She is alert and oriented to person, place, and time. A sensory deficit (Bilateral LEs - L > R) is present.  Skin: Skin is warm and dry.  Psychiatric: She has a normal mood and affect.    Vital signs in last 24 hours: Temp:  [97.9 F (36.6 C)] 97.9 F (36.6 C) (11/15 1048) Pulse Rate:  [64] 64 (11/15 1048) Resp:  [16] 16 (11/15 1048) BP: (109)/(81) 109/81 mmHg (11/15 1048) SpO2:  [95 %] 95 % (11/15 1048) Weight:  [63.107 kg (139 lb 2 oz)] 63.107 kg (139 lb 2 oz) (11/15 1048)  Labs:   Estimated body mass index is 22.30 kg/(m^2) as calculated from the following:   Height as of 11/02/11: 5\' 5"  (1.651 m).   Weight as of 11/02/11: 60.782 kg (134 lb).   Imaging Review Plain radiographs demonstrate severe degenerative joint disease of the left knee(s).  The bone quality appears to be good for age and reported activity level.  Assessment/Plan:  End stage arthritis, left knee   The patient history, physical examination, clinical judgment of the  provider and imaging studies are consistent with end stage degenerative joint disease of the left knee(s) and total knee arthroplasty is deemed medically necessary. The treatment options including medical management, injection therapy arthroscopy and arthroplasty were discussed at length. The risks and benefits of total knee arthroplasty were presented and reviewed. The risks due to aseptic loosening, infection, stiffness, patella tracking problems, thromboembolic complications and other imponderables were discussed. The patient acknowledged the explanation, agreed to proceed with the plan and consent was signed. Patient is being admitted for inpatient treatment for surgery, pain control, PT, OT, prophylactic antibiotics, VTE prophylaxis, progressive ambulation and ADL's and discharge planning. The patient is planning to be discharged to skilled nursing facility.      [place] Raihan Kimmel   PA-C  03/05/2015, 2:46 PM

## 2015-03-05 NOTE — Pre-Procedure Instructions (Signed)
03-05-15 Clearance note 12-14-14(Dr. Laurann Montana) with chart.

## 2015-03-19 ENCOUNTER — Inpatient Hospital Stay (HOSPITAL_COMMUNITY): Payer: Medicare Other | Admitting: Anesthesiology

## 2015-03-19 ENCOUNTER — Encounter (HOSPITAL_COMMUNITY): Payer: Self-pay | Admitting: *Deleted

## 2015-03-19 ENCOUNTER — Inpatient Hospital Stay (HOSPITAL_COMMUNITY)
Admission: AD | Admit: 2015-03-19 | Discharge: 2015-03-21 | DRG: 470 | Disposition: A | Discharge status: Skilled Nursing Facility | Payer: Medicare Other | Source: Ambulatory Visit | Attending: Orthopedic Surgery | Admitting: Orthopedic Surgery

## 2015-03-19 ENCOUNTER — Encounter (HOSPITAL_COMMUNITY)
Admission: AD | Disposition: A | Discharge status: Skilled Nursing Facility | Payer: Self-pay | Source: Ambulatory Visit | Attending: Orthopedic Surgery

## 2015-03-19 DIAGNOSIS — Z96652 Presence of left artificial knee joint: Secondary | ICD-10-CM

## 2015-03-19 DIAGNOSIS — Z87891 Personal history of nicotine dependence: Secondary | ICD-10-CM

## 2015-03-19 DIAGNOSIS — M659 Synovitis and tenosynovitis, unspecified: Secondary | ICD-10-CM | POA: Diagnosis present

## 2015-03-19 DIAGNOSIS — M25562 Pain in left knee: Secondary | ICD-10-CM | POA: Diagnosis present

## 2015-03-19 DIAGNOSIS — Z90710 Acquired absence of both cervix and uterus: Secondary | ICD-10-CM

## 2015-03-19 DIAGNOSIS — Z01812 Encounter for preprocedural laboratory examination: Secondary | ICD-10-CM | POA: Diagnosis not present

## 2015-03-19 DIAGNOSIS — M1712 Unilateral primary osteoarthritis, left knee: Principal | ICD-10-CM | POA: Diagnosis present

## 2015-03-19 DIAGNOSIS — K449 Diaphragmatic hernia without obstruction or gangrene: Secondary | ICD-10-CM | POA: Diagnosis present

## 2015-03-19 DIAGNOSIS — M810 Age-related osteoporosis without current pathological fracture: Secondary | ICD-10-CM | POA: Diagnosis present

## 2015-03-19 DIAGNOSIS — E039 Hypothyroidism, unspecified: Secondary | ICD-10-CM | POA: Diagnosis present

## 2015-03-19 DIAGNOSIS — K219 Gastro-esophageal reflux disease without esophagitis: Secondary | ICD-10-CM | POA: Diagnosis present

## 2015-03-19 DIAGNOSIS — Z85820 Personal history of malignant melanoma of skin: Secondary | ICD-10-CM | POA: Diagnosis not present

## 2015-03-19 DIAGNOSIS — Z96659 Presence of unspecified artificial knee joint: Secondary | ICD-10-CM

## 2015-03-19 HISTORY — PX: TOTAL KNEE ARTHROPLASTY: SHX125

## 2015-03-19 LAB — TYPE AND SCREEN
ABO/RH(D): O POS
Antibody Screen: NEGATIVE

## 2015-03-19 LAB — ABO/RH: ABO/RH(D): O POS

## 2015-03-19 SURGERY — ARTHROPLASTY, KNEE, TOTAL
Anesthesia: Spinal | Site: Knee | Laterality: Left

## 2015-03-19 MED ORDER — HYDROCODONE-ACETAMINOPHEN 7.5-325 MG PO TABS
1.0000 | ORAL_TABLET | ORAL | Status: DC
  Administered 2015-03-19 – 2015-03-20 (×3): 1 via ORAL
  Administered 2015-03-20 (×4): 2 via ORAL
  Administered 2015-03-20: 1 via ORAL
  Administered 2015-03-21: 2 via ORAL
  Administered 2015-03-21: 1 via ORAL
  Administered 2015-03-21: 2 via ORAL
  Filled 2015-03-19 (×4): qty 2
  Filled 2015-03-19: qty 1
  Filled 2015-03-19 (×3): qty 2
  Filled 2015-03-19: qty 1
  Filled 2015-03-19 (×3): qty 2

## 2015-03-19 MED ORDER — HYDROMORPHONE HCL 1 MG/ML IJ SOLN
INTRAMUSCULAR | Status: AC
  Filled 2015-03-19: qty 1

## 2015-03-19 MED ORDER — FENTANYL CITRATE (PF) 100 MCG/2ML IJ SOLN
INTRAMUSCULAR | Status: AC
  Filled 2015-03-19: qty 2

## 2015-03-19 MED ORDER — ASPIRIN EC 325 MG PO TBEC
325.0000 mg | DELAYED_RELEASE_TABLET | Freq: Two times a day (BID) | ORAL | Status: DC
  Administered 2015-03-20 – 2015-03-21 (×3): 325 mg via ORAL
  Filled 2015-03-19 (×5): qty 1

## 2015-03-19 MED ORDER — FENTANYL CITRATE (PF) 100 MCG/2ML IJ SOLN
INTRAMUSCULAR | Status: DC | PRN
  Administered 2015-03-19 (×2): 50 ug via INTRAVENOUS

## 2015-03-19 MED ORDER — BUPIVACAINE-EPINEPHRINE (PF) 0.25% -1:200000 IJ SOLN
INTRAMUSCULAR | Status: DC | PRN
  Administered 2015-03-19: 30 mL

## 2015-03-19 MED ORDER — POLYETHYLENE GLYCOL 3350 17 G PO PACK
17.0000 g | PACK | Freq: Two times a day (BID) | ORAL | Status: DC
  Administered 2015-03-19 – 2015-03-20 (×3): 17 g via ORAL

## 2015-03-19 MED ORDER — ALUM & MAG HYDROXIDE-SIMETH 200-200-20 MG/5ML PO SUSP: 30.0000 mL | ORAL | Status: DC | PRN

## 2015-03-19 MED ORDER — LACTATED RINGERS IV SOLN
INTRAVENOUS | Status: DC
  Administered 2015-03-19 (×2): via INTRAVENOUS
  Administered 2015-03-19: 1000 mL via INTRAVENOUS

## 2015-03-19 MED ORDER — PROPOFOL 500 MG/50ML IV EMUL
INTRAVENOUS | Status: DC | PRN
  Administered 2015-03-19: 75 ug/kg/min via INTRAVENOUS

## 2015-03-19 MED ORDER — BUPIVACAINE-EPINEPHRINE (PF) 0.25% -1:200000 IJ SOLN
INTRAMUSCULAR | Status: AC
  Filled 2015-03-19: qty 30

## 2015-03-19 MED ORDER — KETOROLAC TROMETHAMINE 30 MG/ML IJ SOLN
INTRAMUSCULAR | Status: DC | PRN
  Administered 2015-03-19: 30 mg via INTRA_ARTICULAR

## 2015-03-19 MED ORDER — METHOCARBAMOL 1000 MG/10ML IJ SOLN
500.0000 mg | Freq: Four times a day (QID) | INTRAVENOUS | Status: DC | PRN
  Administered 2015-03-19: 500 mg via INTRAVENOUS
  Filled 2015-03-19 (×2): qty 5

## 2015-03-19 MED ORDER — KETOROLAC TROMETHAMINE 30 MG/ML IJ SOLN
INTRAMUSCULAR | Status: AC
  Filled 2015-03-19: qty 1

## 2015-03-19 MED ORDER — DIPHENHYDRAMINE HCL 25 MG PO CAPS: 25.0000 mg | ORAL_CAPSULE | Freq: Four times a day (QID) | ORAL | Status: DC | PRN

## 2015-03-19 MED ORDER — FERROUS SULFATE 325 (65 FE) MG PO TABS
325.0000 mg | ORAL_TABLET | Freq: Three times a day (TID) | ORAL | Status: DC
  Administered 2015-03-20: 325 mg via ORAL
  Filled 2015-03-19 (×7): qty 1

## 2015-03-19 MED ORDER — CHLORHEXIDINE GLUCONATE 4 % EX LIQD: 60.0000 mL | Freq: Once | CUTANEOUS | Status: DC

## 2015-03-19 MED ORDER — CELECOXIB 200 MG PO CAPS
200.0000 mg | ORAL_CAPSULE | Freq: Two times a day (BID) | ORAL | Status: DC
  Administered 2015-03-19 – 2015-03-21 (×4): 200 mg via ORAL
  Filled 2015-03-19 (×6): qty 1

## 2015-03-19 MED ORDER — HYDROMORPHONE HCL 1 MG/ML IJ SOLN
0.5000 mg | INTRAMUSCULAR | Status: DC | PRN
  Administered 2015-03-19: 1 mg via INTRAVENOUS
  Filled 2015-03-19: qty 1

## 2015-03-19 MED ORDER — EPHEDRINE SULFATE 50 MG/ML IJ SOLN
INTRAMUSCULAR | Status: DC | PRN
  Administered 2015-03-19: 5 mg via INTRAVENOUS
  Administered 2015-03-19: 2.5 mg via INTRAVENOUS
  Administered 2015-03-19 (×2): 5 mg via INTRAVENOUS
  Administered 2015-03-19: 2.5 mg via INTRAVENOUS

## 2015-03-19 MED ORDER — ZOLPIDEM TARTRATE 5 MG PO TABS
5.0000 mg | ORAL_TABLET | Freq: Every evening | ORAL | Status: DC | PRN
  Administered 2015-03-20: 5 mg via ORAL
  Filled 2015-03-19 (×2): qty 1

## 2015-03-19 MED ORDER — HYDROMORPHONE HCL 1 MG/ML IJ SOLN
0.2500 mg | INTRAMUSCULAR | Status: DC | PRN
  Administered 2015-03-19: 0.5 mg via INTRAVENOUS

## 2015-03-19 MED ORDER — SODIUM CHLORIDE 0.9 % IJ SOLN
INTRAMUSCULAR | Status: DC | PRN
  Administered 2015-03-19: 30 mL

## 2015-03-19 MED ORDER — ONDANSETRON HCL 4 MG/2ML IJ SOLN
INTRAMUSCULAR | Status: DC | PRN
  Administered 2015-03-19 (×2): 2 mg via INTRAVENOUS

## 2015-03-19 MED ORDER — ONDANSETRON HCL 4 MG/2ML IJ SOLN
4.0000 mg | Freq: Four times a day (QID) | INTRAMUSCULAR | Status: DC | PRN
  Administered 2015-03-19: 4 mg via INTRAVENOUS
  Filled 2015-03-19: qty 2

## 2015-03-19 MED ORDER — MIDAZOLAM HCL 5 MG/5ML IJ SOLN
INTRAMUSCULAR | Status: DC | PRN
  Administered 2015-03-19 (×2): 1 mg via INTRAVENOUS

## 2015-03-19 MED ORDER — CEFAZOLIN SODIUM-DEXTROSE 2-3 GM-% IV SOLR
2.0000 g | Freq: Four times a day (QID) | INTRAVENOUS | Status: AC
  Administered 2015-03-19 – 2015-03-20 (×2): 2 g via INTRAVENOUS
  Filled 2015-03-19 (×2): qty 50

## 2015-03-19 MED ORDER — LACTATED RINGERS IV SOLN: INTRAVENOUS | Status: DC

## 2015-03-19 MED ORDER — SODIUM CHLORIDE 0.9 % IJ SOLN
INTRAMUSCULAR | Status: AC
  Filled 2015-03-19: qty 50

## 2015-03-19 MED ORDER — DOCUSATE SODIUM 100 MG PO CAPS
100.0000 mg | ORAL_CAPSULE | Freq: Two times a day (BID) | ORAL | Status: DC
  Administered 2015-03-19 – 2015-03-21 (×4): 100 mg via ORAL

## 2015-03-19 MED ORDER — CEFAZOLIN SODIUM-DEXTROSE 2-3 GM-% IV SOLR
2.0000 g | INTRAVENOUS | Status: AC
  Administered 2015-03-19: 2 g via INTRAVENOUS

## 2015-03-19 MED ORDER — PROPOFOL 10 MG/ML IV BOLUS
INTRAVENOUS | Status: AC
  Filled 2015-03-19: qty 40

## 2015-03-19 MED ORDER — ONDANSETRON HCL 4 MG PO TABS: 4.0000 mg | ORAL_TABLET | Freq: Four times a day (QID) | ORAL | Status: DC | PRN

## 2015-03-19 MED ORDER — SODIUM CHLORIDE 0.9 % IV SOLN
INTRAVENOUS | Status: DC
  Administered 2015-03-19 – 2015-03-20 (×2): via INTRAVENOUS
  Filled 2015-03-19 (×6): qty 1000

## 2015-03-19 MED ORDER — CEFAZOLIN SODIUM-DEXTROSE 2-3 GM-% IV SOLR
INTRAVENOUS | Status: AC
  Filled 2015-03-19: qty 50

## 2015-03-19 MED ORDER — MIDAZOLAM HCL 2 MG/2ML IJ SOLN
INTRAMUSCULAR | Status: AC
  Filled 2015-03-19: qty 2

## 2015-03-19 MED ORDER — MENTHOL 3 MG MT LOZG: 1.0000 | LOZENGE | OROMUCOSAL | Status: DC | PRN

## 2015-03-19 MED ORDER — BISACODYL 10 MG RE SUPP: 10.0000 mg | Freq: Every day | RECTAL | Status: DC | PRN

## 2015-03-19 MED ORDER — TRANEXAMIC ACID 1000 MG/10ML IV SOLN
1000.0000 mg | INTRAVENOUS | Status: AC
  Administered 2015-03-19: 1000 mg via INTRAVENOUS
  Filled 2015-03-19: qty 10

## 2015-03-19 MED ORDER — PHENOL 1.4 % MT LIQD
1.0000 | OROMUCOSAL | Status: DC | PRN
  Filled 2015-03-19: qty 177

## 2015-03-19 MED ORDER — BUPIVACAINE IN DEXTROSE 0.75-8.25 % IT SOLN
INTRATHECAL | Status: DC | PRN
  Administered 2015-03-19: 2 mL via INTRATHECAL

## 2015-03-19 MED ORDER — LEVOTHYROXINE SODIUM 50 MCG PO TABS
50.0000 ug | ORAL_TABLET | Freq: Every day | ORAL | Status: DC
  Administered 2015-03-20 – 2015-03-21 (×2): 50 ug via ORAL
  Filled 2015-03-19 (×3): qty 1

## 2015-03-19 MED ORDER — MAGNESIUM CITRATE PO SOLN: 1.0000 | Freq: Once | ORAL | Status: DC | PRN

## 2015-03-19 MED ORDER — METOCLOPRAMIDE HCL 10 MG PO TABS: 5.0000 mg | ORAL_TABLET | Freq: Three times a day (TID) | ORAL | Status: DC | PRN

## 2015-03-19 MED ORDER — DEXAMETHASONE SODIUM PHOSPHATE 10 MG/ML IJ SOLN
INTRAMUSCULAR | Status: DC | PRN
  Administered 2015-03-19: 10 mg via INTRAVENOUS

## 2015-03-19 MED ORDER — DEXAMETHASONE SODIUM PHOSPHATE 10 MG/ML IJ SOLN
10.0000 mg | Freq: Once | INTRAMUSCULAR | Status: AC
  Administered 2015-03-20: 10 mg via INTRAVENOUS
  Filled 2015-03-19: qty 1

## 2015-03-19 MED ORDER — METHOCARBAMOL 500 MG PO TABS
500.0000 mg | ORAL_TABLET | Freq: Four times a day (QID) | ORAL | Status: DC | PRN
  Administered 2015-03-20 (×4): 500 mg via ORAL
  Filled 2015-03-19 (×4): qty 1

## 2015-03-19 MED ORDER — METOCLOPRAMIDE HCL 5 MG/ML IJ SOLN
5.0000 mg | Freq: Three times a day (TID) | INTRAMUSCULAR | Status: DC | PRN
  Administered 2015-03-19: 10 mg via INTRAVENOUS
  Filled 2015-03-19: qty 2

## 2015-03-19 SURGICAL SUPPLY — 45 items
BAG DECANTER FOR FLEXI CONT (MISCELLANEOUS) IMPLANT
BAG SPEC THK2 15X12 ZIP CLS (MISCELLANEOUS)
BAG ZIPLOCK 12X15 (MISCELLANEOUS) IMPLANT
BANDAGE ELASTIC 6 VELCRO ST LF (GAUZE/BANDAGES/DRESSINGS) ×2 IMPLANT
BLADE SAW SGTL 13.0X1.19X90.0M (BLADE) ×2 IMPLANT
BOWL SMART MIX CTS (DISPOSABLE) ×2 IMPLANT
CAPT KNEE TOTAL 3 ATTUNE ×1 IMPLANT
CEMENT HV SMART SET (Cement) ×1 IMPLANT
CLOTH BEACON ORANGE TIMEOUT ST (SAFETY) ×2 IMPLANT
CUFF TOURN SGL QUICK 34 (TOURNIQUET CUFF) ×2
CUFF TRNQT CYL 34X4X40X1 (TOURNIQUET CUFF) ×1 IMPLANT
DECANTER SPIKE VIAL GLASS SM (MISCELLANEOUS) ×2 IMPLANT
DRAPE U-SHAPE 47X51 STRL (DRAPES) ×2 IMPLANT
DRSG AQUACEL AG ADV 3.5X10 (GAUZE/BANDAGES/DRESSINGS) ×2 IMPLANT
DURAPREP 26ML APPLICATOR (WOUND CARE) ×4 IMPLANT
ELECT REM PT RETURN 9FT ADLT (ELECTROSURGICAL) ×2
ELECTRODE REM PT RTRN 9FT ADLT (ELECTROSURGICAL) ×1 IMPLANT
GLOVE BIOGEL M 7.0 STRL (GLOVE) IMPLANT
GLOVE BIOGEL PI IND STRL 7.5 (GLOVE) ×1 IMPLANT
GLOVE BIOGEL PI IND STRL 8.5 (GLOVE) ×1 IMPLANT
GLOVE BIOGEL PI INDICATOR 7.5 (GLOVE) ×1
GLOVE BIOGEL PI INDICATOR 8.5 (GLOVE) ×1
GLOVE ECLIPSE 8.0 STRL XLNG CF (GLOVE) ×2 IMPLANT
GLOVE ORTHO TXT STRL SZ7.5 (GLOVE) ×4 IMPLANT
GOWN STRL REUS W/TWL LRG LVL3 (GOWN DISPOSABLE) ×2 IMPLANT
GOWN STRL REUS W/TWL XL LVL3 (GOWN DISPOSABLE) ×2 IMPLANT
HANDPIECE INTERPULSE COAX TIP (DISPOSABLE) ×2
LIQUID BAND (GAUZE/BANDAGES/DRESSINGS) ×2 IMPLANT
MANIFOLD NEPTUNE II (INSTRUMENTS) ×2 IMPLANT
PACK TOTAL KNEE CUSTOM (KITS) ×2 IMPLANT
POSITIONER SURGICAL ARM (MISCELLANEOUS) ×2 IMPLANT
SET HNDPC FAN SPRY TIP SCT (DISPOSABLE) ×1 IMPLANT
SET PAD KNEE POSITIONER (MISCELLANEOUS) ×2 IMPLANT
SUCTION FRAZIER 12FR DISP (SUCTIONS) ×1 IMPLANT
SUT MNCRL AB 4-0 PS2 18 (SUTURE) ×2 IMPLANT
SUT VIC AB 1 CT1 36 (SUTURE) ×2 IMPLANT
SUT VIC AB 2-0 CT1 27 (SUTURE) ×6
SUT VIC AB 2-0 CT1 TAPERPNT 27 (SUTURE) ×3 IMPLANT
SUT VLOC 180 0 24IN GS25 (SUTURE) ×2 IMPLANT
SYR 50ML LL SCALE MARK (SYRINGE) ×1 IMPLANT
TRAY FOLEY W/METER SILVER 14FR (SET/KITS/TRAYS/PACK) ×2 IMPLANT
TRAY FOLEY W/METER SILVER 16FR (SET/KITS/TRAYS/PACK) ×1 IMPLANT
WATER STERILE IRR 1500ML POUR (IV SOLUTION) ×2 IMPLANT
WRAP KNEE MAXI GEL POST OP (GAUZE/BANDAGES/DRESSINGS) ×2 IMPLANT
YANKAUER SUCT BULB TIP 10FT TU (MISCELLANEOUS) ×2 IMPLANT

## 2015-03-19 NOTE — Discharge Instructions (Signed)

## 2015-03-19 NOTE — Anesthesia Procedure Notes (Signed)
Spinal Patient location during procedure: OR Start time: 03/19/2015 12:54 PM Staffing Anesthesiologist: Rod Mae Performed by: anesthesiologist  Preanesthetic Checklist Completed: patient identified, site marked, surgical consent, pre-op evaluation, timeout performed, IV checked, risks and benefits discussed and monitors and equipment checked Spinal Block Patient position: sitting Prep: Betadine, site prepped and draped and alcohol swabs Patient monitoring: heart rate, continuous pulse ox and blood pressure Approach: right paramedian Location: L2-3 Injection technique: single-shot Needle Needle type: Spinocan  Needle gauge: 22 G Needle length: 9 cm Additional Notes Kit expiration date checked. Negative heme, paresthesias, tolerated well.  Several attempts-scoliosis

## 2015-03-19 NOTE — Op Note (Signed)
NAME:  Othello RECORD NO.:  [id number]                             FACILITY:  [facility]      PHYSICIAN:  Pietro Cassis. Alvan Dame, M.D.  DATE OF BIRTH:  07/02/1940      DATE OF PROCEDURE:  03/19/2015                                     OPERATIVE REPORT         PREOPERATIVE DIAGNOSIS:  Left knee osteoarthritis.      POSTOPERATIVE DIAGNOSIS:  Left knee osteoarthritis.      FINDINGS:  The patient was noted to have complete loss of cartilage and   bone-on-bone arthritis with associated osteophytes in the patellofemoral compartments of   the knee with a significant synovitis and associated effusion.      PROCEDURE:  Left total knee replacement.      COMPONENTS USED:  DePuy Attune rotating platform posterior stabilized knee   system, a size 4 N femur, 4 tibia, size 8 mm PS AOX insert, and 35 anatomic patellar   button.      SURGEON:  Pietro Cassis. Alvan Dame, M.D.      ASSISTANT:  Danae Orleans, PA-C.      ANESTHESIA:  Spinal.      SPECIMENS:  None.      COMPLICATION:  None.      DRAINS:  None.  EBL: <100cc      TOURNIQUET TIME:   Total Tourniquet Time Documented: Thigh (Left) - 27 minutes Total: Thigh (Left) - 27 minutes  .      The patient was stable to the recovery room.      INDICATION FOR PROCEDURE:  TEMISHA SABIN is a 74 y.o. female patient of   mine.  The patient had been seen, evaluated, and treated conservatively in the   office with medication, activity modification, and injections.  The patient had   radiographic changes of bone-on-bone arthritis with endplate sclerosis and osteophytes noted.      The patient failed conservative measures including medication, injections, and activity modification, and at this point was ready for more definitive measures.   Based on the radiographic changes and failed conservative measures, the patient   decided to proceed with total knee replacement.  Risks of infection,   DVT, component failure, need  for revision surgery, postop course, and   expectations were all   discussed and reviewed.  Consent was obtained for benefit of pain   relief.      PROCEDURE IN DETAIL:  The patient was brought to the operative theater.   Once adequate anesthesia, preoperative antibiotics, 2 gm of Ancef, 1 gm of Tranexamic Acid, and 10 mg of Decadron administered, the patient was positioned supine with the left thigh tourniquet placed.  The  left lower extremity was prepped and draped in sterile fashion.  A time-   out was performed identifying the patient, planned procedure, and   extremity.      The left lower extremity was placed in the [facility] leg holder.  The leg was   exsanguinated, tourniquet elevated to 250 mmHg.  A midline incision was   made followed  by median parapatellar arthrotomy.  Following initial   exposure, attention was first directed to the patella.  Precut   measurement was noted to be 19 mm.  I resected down to 14 mm and used a   35 patellar button to restore patellar height as well as cover the cut   surface.      The lug holes were drilled and a metal shim was placed to protect the   patella from retractors and saw blades.      At this point, attention was now directed to the femur.  The femoral   canal was opened with a drill, irrigated to try to prevent fat emboli.  An   intramedullary rod was passed at 3 degrees valgus, 8 mm of bone was   resected off the distal femur.  Following this resection, the tibia was   subluxated anteriorly.  Using the extramedullary guide, 2 mm of bone was resected off   the proximal medial tibia.  We confirmed the gap would be   stable medially and laterally with a size 6 mm insert as well as confirmed   the cut was perpendicular in the coronal plane, checking with an alignment rod.      Once this was done, I sized the femur to be a size 4 in the anterior-   posterior dimension, chose a narrow component based on medial and   lateral dimension.   The size 5 rotation block was then pinned in   position anterior referenced using the C-clamp to set rotation.  The   anterior, posterior, and  chamfer cuts were made without difficulty nor   notching making certain that I was along the anterior cortex to help   with flexion gap stability.      The final box cut was made off the lateral aspect of distal femur.      At this point, the tibia was sized to be a size 4, the size 4 tray was   then pinned in position through the medial third of the tubercle,   drilled, and keel punched.  Trial reduction was now carried with a 4 femur,  4 tibia, a size 8 mm PS insert, and the 35 anatomic patella botton.  The knee was brought to   extension, full extension with good flexion stability with the patella   tracking through the trochlea without application of pressure.  Femoral lugs were drilled. Given   all these findings, the trial components removed.  Final components were   opened and cement was mixed.  The knee was irrigated with normal saline   solution and pulse lavage.  The synovial lining was   then injected with 30 cc of 0.25% Marcaine with epinephrine and 1 cc of Toradol plus 30 cc of NS for a total  total of 61 cc.      The knee was irrigated.  Final implants were then cemented onto clean and   dried cut surfaces of bone with the knee brought to extension with a size 8 mm trial insert.      Once the cement had fully cured, the excess cement was removed   throughout the knee.  I confirmed I was satisfied with the range of   motion and stability, and the final size 8 mm PS AOX insert was chosen.  It was   placed into the knee.      The tourniquet had been let down at 27 minutes.  No significant  hemostasis required.  The   extensor mechanism was then reapproximated using #1 Vicryl and #0 V-lock sutures with the knee   in flexion.  The   remaining wound was closed with 2-0 Vicryl and running 4-0 Monocryl.   The knee was cleaned, dried,  dressed sterilely using Dermabond and   Aquacel dressing.  The patient was then   brought to recovery room in stable condition, tolerating the procedure   well.   Please note that Physician Assistant, Danae Orleans, PA-C, was present for the entirety of the case, and was utilized for pre-operative positioning, peri-operative retractor management, general facilitation of the procedure.  He was also utilized for primary wound closure at the end of the case.              Pietro Cassis Alvan Dame, M.D.    03/19/2015 2:13 PM

## 2015-03-19 NOTE — Transfer of Care (Signed)
Immediate Anesthesia Transfer of Care Note  Patient: Morgan Gregory  Procedure(s) Performed: Procedure(s): LEFT TOTAL KNEE ARTHROPLASTY (Left)  Patient Location: PACU  Anesthesia Type:Spinal  Level of Consciousness: awake, alert , oriented and patient cooperative  Airway & Oxygen Therapy: Patient Spontanous Breathing and Patient connected to face mask oxygen  Post-op Assessment: Report given to RN and Post -op Vital signs reviewed and stable  Post vital signs: stable  Last Vitals:  Filed Vitals:   03/19/15 1020  BP: 148/76  Pulse: 67  Temp: 36.4 C  Resp: 18    Complications: No apparent anesthesia complications   L5 spinal level

## 2015-03-19 NOTE — Anesthesia Postprocedure Evaluation (Signed)
Anesthesia Post Note  Patient: Morgan Gregory  Procedure(s) Performed: Procedure(s) (LRB): LEFT TOTAL KNEE ARTHROPLASTY (Left)  Patient location during evaluation: PACU Anesthesia Type: Spinal Level of consciousness: awake and alert Pain management: pain level controlled Vital Signs Assessment: post-procedure vital signs reviewed and stable Respiratory status: spontaneous breathing, nonlabored ventilation, respiratory function stable and patient connected to nasal cannula oxygen Cardiovascular status: blood pressure returned to baseline and stable Postop Assessment: no signs of nausea or vomiting Anesthetic complications: no    Last Vitals:  Filed Vitals:   03/19/15 1818 03/19/15 1847  BP: 136/68 136/78  Pulse: 68 66  Temp: 36.8 C 36.8 C  Resp: 16 16    Last Pain:  Filed Vitals:   03/19/15 1849  PainSc: 5     LLE Motor Response: Purposeful movement (flexes thigh muscle and wiggles toes)   RLE Motor Response: Purposeful movement (bends at knee)   L Sensory Level: S1-Sole of foot, small toes R Sensory Level: S1-Sole of foot, small toes  [name] L

## 2015-03-19 NOTE — Interval H&P Note (Signed)
History and Physical Interval Note:  03/19/2015 11:39 AM  Morgan Gregory  has presented today for surgery, with the diagnosis of left knee osteoarthritis and pain  The various methods of treatment have been discussed with the patient and family. After consideration of risks, benefits and other options for treatment, the patient has consented to  Procedure(s): LEFT TOTAL KNEE ARTHROPLASTY (Left) as a surgical intervention .  The patient's history has been reviewed, patient examined, no change in status, stable for surgery.  I have reviewed the patient's chart and labs.  Questions were answered to the patient's satisfaction.     [name]

## 2015-03-19 NOTE — Anesthesia Preprocedure Evaluation (Signed)
Anesthesia Evaluation  Patient identified by MRN, date of birth, ID band Patient awake    Reviewed: Allergy & Precautions, H&P , NPO status , Patient's Chart, lab work & pertinent test results  Airway Mallampati: II  TM Distance: >3 FB Neck ROM: full    Dental no notable dental hx. (+) Dental Advisory Given, Teeth Intact   Pulmonary neg pulmonary ROS, former smoker,    Pulmonary exam normal breath sounds clear to auscultation       Cardiovascular Exercise Tolerance: Good negative cardio ROS Normal cardiovascular exam Rhythm:regular Rate:Normal     Neuro/Psych Neuropathy in feet. Cervical spine surgery  Neuromuscular disease negative neurological ROS  negative psych ROS   GI/Hepatic negative GI ROS, Neg liver ROS, hiatal hernia, GERD  Medicated and Controlled,  Endo/Other  negative endocrine ROSHypothyroidism   Renal/GU negative Renal ROS  negative genitourinary   Musculoskeletal   Abdominal   Peds  Hematology negative hematology ROS (+)   Anesthesia Other Findings   Reproductive/Obstetrics negative OB ROS                             Anesthesia Physical Anesthesia Plan  ASA: II  Anesthesia Plan: Spinal   Post-op Pain Management:    Induction:   Airway Management Planned:   Additional Equipment:   Intra-op Plan:   Post-operative Plan:   Informed Consent: I have reviewed the patients History and Physical, chart, labs and discussed the procedure including the risks, benefits and alternatives for the proposed anesthesia with the patient or authorized representative who has indicated his/her understanding and acceptance.   Dental Advisory Given  Plan Discussed with: CRNA and Surgeon  Anesthesia Plan Comments:         Anesthesia Quick Evaluation

## 2015-03-20 ENCOUNTER — Encounter (HOSPITAL_COMMUNITY): Payer: Self-pay | Admitting: Orthopedic Surgery

## 2015-03-20 LAB — BASIC METABOLIC PANEL
ANION GAP: 5 (ref 5–15)
BUN: 10 mg/dL (ref 6–20)
CO2: 25 mmol/L (ref 22–32)
Calcium: 8.2 mg/dL — ABNORMAL LOW (ref 8.9–10.3)
Chloride: 106 mmol/L (ref 101–111)
Creatinine, Ser: 0.48 mg/dL (ref 0.44–1.00)
GFR calc Af Amer: >60 mL/min (ref >60)
GLUCOSE: 143 mg/dL — AB (ref 65–99)
POTASSIUM: 4.3 mmol/L (ref 3.5–5.1)
SODIUM: 136 mmol/L (ref 135–145)

## 2015-03-20 LAB — CBC
HCT: 36.3 % (ref 36.0–46.0)
HEMOGLOBIN: 12 g/dL (ref 12.0–15.0)
MCH: 30.2 pg (ref 26.0–34.0)
MCHC: 33.1 g/dL (ref 30.0–36.0)
MCV: 91.4 fL (ref 78.0–100.0)
PLATELETS: 183 10*3/uL (ref 150–400)
RBC: 3.97 MIL/uL (ref 3.87–5.11)
RDW: 11.9 % (ref 11.5–15.5)
WBC: 9.8 10*3/uL (ref 4.0–10.5)

## 2015-03-20 MED ORDER — FERROUS SULFATE 325 (65 FE) MG PO TABS: 325.0000 mg | ORAL_TABLET | Freq: Three times a day (TID) | ORAL | Status: DC

## 2015-03-20 MED ORDER — ASPIRIN 325 MG PO TBEC: 325.0000 mg | DELAYED_RELEASE_TABLET | Freq: Two times a day (BID) | ORAL | Status: AC

## 2015-03-20 MED ORDER — DOCUSATE SODIUM 100 MG PO CAPS: 100.0000 mg | ORAL_CAPSULE | Freq: Two times a day (BID) | ORAL | Status: DC

## 2015-03-20 MED ORDER — TIZANIDINE HCL 4 MG PO TABS: 4.0000 mg | ORAL_TABLET | Freq: Four times a day (QID) | ORAL | Status: DC | PRN

## 2015-03-20 MED ORDER — HYDROCODONE-ACETAMINOPHEN 7.5-325 MG PO TABS: 1.0000 | ORAL_TABLET | ORAL | Status: DC | PRN

## 2015-03-20 MED ORDER — POLYETHYLENE GLYCOL 3350 17 G PO PACK: 17.0000 g | PACK | Freq: Two times a day (BID) | ORAL | Status: DC

## 2015-03-20 NOTE — Clinical Social Work Note (Signed)
Clinical Social Work Assessment  Patient Details  Name: Morgan Gregory MRN: [id number] Date of Birth: [birth year]  Date of referral:  03/20/15               Reason for consult:  Facility Placement, Discharge Planning                Permission sought to share information with:  Chartered certified accountant granted to share information::  Yes, Verbal Permission Granted  Name::        Agency::     Relationship::     Contact Information:     Housing/Transportation Living arrangements for the past 2 months:  Single Family Home Source of Information:  Patient Patient Interpreter Needed:  None Criminal Activity/Legal Involvement Pertinent to Current Situation/Hospitalization:  No - Comment as needed Significant Relationships:  Other Family Members Lives with:  Self Do you feel safe going back to the place where you live?   (ST Rehab needed.) Need for family participation in patient care:  No (Coment)  Care giving concerns:  Pt's care cannot be managed at home following hospital d/c.   Social Worker assessment / plan:  Pt hospitalized on 03/19/15 for a pre planned Left Total knee Arthroplasty. CSW met wit pt to assist with d/c planning. Pt reports that she has made prior arrangements to have ST Re[facility] at [facility] at d/c. CSW has contacted SNF and d/c plan has been confirmed  pending [place] prior approval. CSW will contact insurance once PT recommendations are available. Employment status:  Retired Nurse, adult PT Recommendations:  [place] / Referral to community resources:     Patient/Family's Response to care: Pt feels ST Rehab is needed.  Patient/Family's Understanding of and Emotional Response to Diagnosis, Current Treatment, and Prognosis:  Pt is aware of er medical status. Pt reports her pain is being controlled. Pt is motivated to work with therapy and is looking forward to having rehab at [facility].  Emotional Assessment Appearance:  Appears stated age Attitude/Demeanor/Rapport:  Other (cooperative) Affect (typically observed):  Calm, Pleasant Orientation:  Oriented to Self, Oriented to Place, Oriented to  Time, Oriented to Situation Alcohol / Substance use:  Not Applicable Psych involvement (Current and /or in the community):  No (Comment)  Discharge Needs  Concerns to be addressed:  Discharge Planning Concerns Readmission within the last 30 days:  No Current discharge risk:  None Barriers to Discharge:  No Barriers Identified   [name]  [phone] 03/20/2015, 9:33 AM

## 2015-03-20 NOTE — Care Management Note (Signed)
Case Management Note  Patient Details  Name: Morgan Gregory MRN: [id number] Date of Birth: 07-Jun-1940  Subjective/Objective:     S/p Left total knee replacement               Action/Plan: Discharge planning per CSW  Expected Discharge Date:  03/21/15               Expected Discharge Plan:  [place]  In-House Referral:  Clinical Social Work  Discharge planning Services  CM Consult  Post Acute Care Choice:  NA Choice offered to:  NA  DME Arranged:  N/A DME Agency:  NA  HH Arranged:  NA HH Agency:  NA  Status of Service:  Completed, signed off  Medicare Important Message Given:    Date Medicare IM Given:    Medicare IM give by:    Date Additional Medicare IM Given:    Additional Medicare Important Message give by:     If discussed at [place] of Stay Meetings, dates discussed:    Additional Comments:  Guadalupe Maple, RN 03/20/2015, 10:18 AM

## 2015-03-20 NOTE — Addendum Note (Signed)
Addendum  created 03/20/15 [id number] by Lollie Sails, CRNA   Modules edited: Charges VN

## 2015-03-20 NOTE — Evaluation (Signed)
Physical Therapy Evaluation Patient Details Name: Morgan Gregory MRN: [id number] DOB: [birth year] Today's Date: 03/20/2015   History of Present Illness  L TKR  Clinical Impression  Pt s/p L TKR presents with decreased L LE strength/ROM and post op pain limiting functional mobility.  Pt would benefit from follow up rehab at SNF level to maximize IND and safety prior to dc home ALONE.    Follow Up Recommendations SNF    Equipment Recommendations  None recommended by PT    Recommendations for Other Services OT consult     Precautions / Restrictions Precautions Precautions: Knee;Fall Restrictions Weight Bearing Restrictions: No Other Position/Activity Restrictions: WBAT      Mobility  Bed Mobility Overal bed mobility: Needs Assistance Bed Mobility: Supine to Sit     Supine to sit: Min assist     General bed mobility comments: cues for sequence and use of R LE to self assist  Transfers Overall transfer level: Needs assistance Equipment used: Rolling walker (2 wheeled) Transfers: Sit to/from Stand Sit to Stand: Min assist         General transfer comment: cues for LE management and use of UEs to self assist  Ambulation/Gait Ambulation/Gait assistance: Min assist Ambulation Distance (Feet): 28 Feet Assistive device: Rolling walker (2 wheeled) Gait Pattern/deviations: Step-to pattern;Decreased step length - right;Decreased step length - left;Shuffle;Trunk flexed     General Gait Details: cues for sequence, posture and position from [employer]            Wheelchair Mobility    Modified Rankin (Stroke Patients Only)       Balance                                             Pertinent Vitals/Pain Pain Assessment: 0-10 Pain Score: 3  Pain Location: L knee Pain Descriptors / Indicators: Aching;Sore Pain Intervention(s): Limited activity within patient's tolerance;Monitored during session;Premedicated before session;Ice applied     Home Living Family/patient expects to be discharged to:: Skilled nursing facility Living Arrangements: Alone                    Prior Function Level of Independence: Independent               Hand Dominance   Dominant Hand: Right    Extremity/Trunk Assessment   Upper Extremity Assessment: Overall WFL for tasks assessed           Lower Extremity Assessment: LLE deficits/detail   LLE Deficits / Details: 3/5 quads with pt performing IND SLR and AAROM at knee -10 - 90  Cervical / Trunk Assessment: Normal  Communication   Communication: No difficulties  Cognition Arousal/Alertness: Awake/alert Behavior During Therapy: WFL for tasks assessed/performed Overall Cognitive Status: Within Functional Limits for tasks assessed                      General Comments      Exercises Total Joint Exercises Ankle Circles/Pumps: AROM;Both;15 reps;Supine Quad Sets: AROM;Both;10 reps;Supine Heel Slides: AAROM;Left;15 reps;Supine Straight Leg Raises: AAROM;AROM;Left;10 reps;Supine      Assessment/Plan    PT Assessment Patient needs continued PT services  PT Diagnosis Difficulty walking   PT Problem List Decreased strength;Decreased range of motion;Decreased activity tolerance;Decreased mobility;Decreased knowledge of use of DME;Pain  PT Treatment Interventions DME instruction;Gait training;Stair training;Functional mobility training;Therapeutic activities;Therapeutic exercise;Patient/family education  PT Goals (Current goals can be found in the Care Plan section) Acute Rehab PT Goals Patient Stated Goal: Resume previous lifestyle with decreased pain PT Goal Formulation: With patient Time For Goal Achievement: 03/23/15 Potential to Achieve Goals: Good    Frequency 7X/week   Barriers to discharge        Co-evaluation               End of Session Equipment Utilized During Treatment: Gait belt Activity Tolerance: Patient tolerated treatment  well Patient left: in chair;with call bell/phone within reach Nurse Communication: Mobility status         Time: 0927-1000 PT Time Calculation (min) (ACUTE ONLY): 33 min   Charges:   PT Evaluation $Initial PT Evaluation Tier I: 1 Procedure PT Treatments $Therapeutic Exercise: 8-22 mins   PT G Codes:        Raidyn Breiner April 10, 2015, 10:40 AM

## 2015-03-20 NOTE — Progress Notes (Signed)
Physical Therapy Treatment Patient Details Name: Morgan Gregory MRN: [id number] DOB: [birth year] Today's Date: 07-Apr-2015    History of Present Illness L TKR    PT Comments    Steady progress with mobility.  Follow Up Recommendations  SNF     Equipment Recommendations  None recommended by PT    Recommendations for Other Services       Precautions / Restrictions Precautions Precautions: Knee;Fall Restrictions Weight Bearing Restrictions: No Other Position/Activity Restrictions: WBAT    Mobility  Bed Mobility Overal bed mobility: Needs Assistance Bed Mobility: Supine to Sit     Supine to sit: Min assist     General bed mobility comments: cues for sequence and use of R LE to self assist  Transfers Overall transfer level: Needs assistance Equipment used: Rolling walker (2 wheeled) Transfers: Sit to/from Stand Sit to Stand: Min guard         General transfer comment: Cues for hand placement  Ambulation/Gait Ambulation/Gait assistance: Min assist;Min guard Ambulation Distance (Feet): 44 Feet Assistive device: Rolling walker (2 wheeled) Gait Pattern/deviations: Step-to pattern;Decreased step length - right;Decreased step length - left;Shuffle;Trunk flexed Gait velocity: decr   General Gait Details: cues for sequence, posture and position from [employer]            Wheelchair Mobility    Modified Rankin (Stroke Patients Only)       Balance                                    Cognition Arousal/Alertness: Awake/alert Behavior During Therapy: WFL for tasks assessed/performed Overall Cognitive Status: Within Functional Limits for tasks assessed                      Exercises Total Joint Exercises Ankle Circles/Pumps: AROM;Both;15 reps;Supine Quad Sets: AROM;Both;10 reps;Supine Heel Slides: AAROM;Left;15 reps;Supine Straight Leg Raises: AAROM;AROM;Left;10 reps;Supine    General Comments        Pertinent  Vitals/Pain Pain Assessment: 0-10 Pain Score: 4  Pain Location: L knee Pain Descriptors / Indicators: Aching;Sore Pain Intervention(s): Limited activity within patient's tolerance;Monitored during session;Premedicated before session;Ice applied    Home Living                      Prior Function            PT Goals (current goals can now be found in the care plan section) Acute Rehab PT Goals Patient Stated Goal: Resume previous lifestyle with decreased pain PT Goal Formulation: With patient Time For Goal Achievement: 03/23/15 Potential to Achieve Goals: Good Progress towards PT goals: Progressing toward goals    Frequency  7X/week    PT Plan Current plan remains appropriate    Co-evaluation             End of Session Equipment Utilized During Treatment: Gait belt Activity Tolerance: Patient tolerated treatment well Patient left: in chair;with call bell/phone within reach     Time: [id number] PT Time Calculation (min) (ACUTE ONLY): 36 min  Charges:  $Gait Training: 8-22 mins $Therapeutic Exercise: 8-22 mins                    G Codes:      Teela Narducci 04-07-15, 4:59 PM

## 2015-03-20 NOTE — NC FL2 (Signed)
[place] LEVEL OF CARE SCREENING TOOL     IDENTIFICATION  Patient Name: Morgan Gregory Birthdate: February 12, 1941 Sex: female Admission Date (Current Location): 03/19/2015  [facility] and [place] Number:     Facility and Address:  [facility],  [place] [address] [place]      Provider Number: [id number]  Attending Physician Name and Address:  Paralee Cancel, MD  Relative Name and Phone Number:       Current Level of Care: Hospital Recommended Level of Care: [place] Prior Approval Number:    Date Approved/Denied:   PASRR Number: [id number]  Discharge Plan: SNF    Current Diagnoses: Patient Active Problem List   Diagnosis Date Noted  . 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 S/P knee replacement 03/19/2015    Orientation ACTIVITIES/SOCIAL BLADDER RESPIRATION    Self, Time, Situation, Place  Passive Continent Normal  BEHAVIORAL SYMPTOMS/MOOD NEUROLOGICAL BOWEL NUTRITION STATUS   (no behaviors)   Continent Diet  PHYSICIAN VISITS COMMUNICATION OF NEEDS Height & Weight Skin    Verbally 5\' 4"  (162.6 cm) 139 lbs. Surgical wounds          AMBULATORY STATUS RESPIRATION    Assist extensive Normal      Personal Care Assistance Level of Assistance  Bathing, Feeding, Dressing Bathing Assistance: Limited assistance Feeding assistance: Independent Dressing Assistance: Limited assistance      Functional Limitations Info  Sight, Hearing, Speech Sight Info: Adequate Hearing Info: Adequate Speech Info: Adequate       SPECIAL CARE FACTORS FREQUENCY                      Additional Factors Info  Code Status Code Status Info: Full Code             Current Medications (03/20/2015):  This is the current hospital active medication list Current Facility-Administered Medications  Medication Dose Route Frequency Provider Last Rate Last Dose  . alum & mag hydroxide-simeth (MAALOX/MYLANTA) 200-200-20 MG/5ML suspension 30 mL   30 mL Oral Q4H PRN Danae Orleans, PA-C      . aspirin EC tablet 325 mg  325 mg Oral BID Danae Orleans, PA-C   325 mg at 03/20/15 0725  . bisacodyl (DULCOLAX) suppository 10 mg  10 mg Rectal Daily PRN Danae Orleans, PA-C      . celecoxib (CELEBREX) capsule 200 mg  200 mg Oral Q12H Danae Orleans, PA-C   200 mg at 03/19/15 2210  . dexamethasone (DECADRON) injection 10 mg  10 mg Intravenous Once Danae Orleans, PA-C      . diphenhydrAMINE (BENADRYL) capsule 25 mg  25 mg Oral Q6H PRN Danae Orleans, PA-C      . docusate sodium (COLACE) capsule 100 mg  100 mg Oral BID Danae Orleans, PA-C   100 mg at 03/19/15 2210  . ferrous sulfate tablet 325 mg  325 mg Oral TID PC Danae Orleans, PA-C      . HYDROcodone-acetaminophen (NORCO) 7.5-325 MG per tablet 1-2 tablet  1-2 tablet Oral Q4H Danae Orleans, PA-C   1 tablet at 03/20/15 0545  . HYDROmorphone (DILAUDID) injection 0.5-1 mg  0.5-1 mg Intravenous Q2H PRN Danae Orleans, PA-C   1 mg at 03/19/15 1846  . levothyroxine (SYNTHROID, LEVOTHROID) tablet 50 mcg  50 mcg Oral QAC breakfast Danae Orleans, PA-C   50 mcg at 03/20/15 0725  . magnesium citrate solution 1 Bottle  1 Bottle Oral Once PRN Danae Orleans, PA-C      .  menthol-cetylpyridinium (CEPACOL) lozenge 3 mg  1 lozenge Oral PRN Danae Orleans, PA-C       Or  . phenol (CHLORASEPTIC) mouth spray 1 spray  1 spray Mouth/Throat PRN Danae Orleans, PA-C      . methocarbamol (ROBAXIN) tablet 500 mg  500 mg Oral Q6H PRN Danae Orleans, PA-C   500 mg at 03/20/15 0204   Or  . methocarbamol (ROBAXIN) 500 mg in dextrose 5 % 50 mL IVPB  500 mg Intravenous Q6H PRN Danae Orleans, PA-C   500 mg at 03/19/15 1509  . metoCLOPramide (REGLAN) tablet 5-10 mg  5-10 mg Oral Q8H PRN Danae Orleans, PA-C       Or  . metoCLOPramide (REGLAN) injection 5-10 mg  5-10 mg Intravenous Q8H PRN Danae Orleans, PA-C   10 mg at 03/19/15 2111  . ondansetron (ZOFRAN) tablet 4 mg  4 mg Oral Q6H PRN Danae Orleans, PA-C       Or  .  ondansetron [facility]) injection 4 mg  4 mg Intravenous Q6H PRN Danae Orleans, PA-C   4 mg at 03/19/15 1936  . polyethylene glycol (MIRALAX / GLYCOLAX) packet 17 g  17 g Oral BID Danae Orleans, PA-C   17 g at 03/19/15 2211  . sodium chloride 0.9 % 1,000 mL with potassium chloride 10 mEq infusion   Intravenous Continuous Danae Orleans, PA-C 100 mL/hr at 03/20/15 0205    . zolpidem (AMBIEN) tablet 5 mg  5 mg Oral QHS PRN Danae Orleans, PA-C   5 mg at 03/20/15 0007     Discharge Medications: Please see discharge summary for a list of discharge medications.  Relevant Imaging Results:  Relevant Lab Results:  Recent Labs    Additional Information SS # [id number]  Magdalyn Arenivas, Randall An, LCSW

## 2015-03-20 NOTE — Progress Notes (Signed)
     Subjective: 1 Day Post-Op Procedure(s) (LRB): LEFT TOTAL KNEE ARTHROPLASTY (Left)    Seen by Dr. Alvan Dame. Patient reports pain as mild, pain controlled. No events throughout the night.   Objective:   VITALS:   Filed Vitals:   03/20/15 0202 03/20/15 0551  BP: 115/71 105/62  Pulse: 68 60  Temp: 97.6 F (36.4 C) 97.7 F (36.5 C)  Resp: 16 16    Dorsiflexion/Plantar flexion intact Incision: dressing C/D/I No cellulitis present Compartment soft  LABS  Recent Labs  03/20/15 0520  HGB 12.0  HCT 36.3  WBC 9.8  PLT 183     Recent Labs  03/20/15 0520  NA 136  K 4.3  BUN 10  CREATININE 0.48  GLUCOSE 143*     Assessment/Plan: 1 Day Post-Op Procedure(s) (LRB): LEFT TOTAL KNEE ARTHROPLASTY (Left) Foley cath d/c'ed Advance diet Up with therapy D/C IV fluids Discharge to SNF eventually, when ready       [place] P[place]. Jahree Dermody   PAC  03/20/2015, 8:32 AM

## 2015-03-20 NOTE — Evaluation (Signed)
Occupational Therapy Evaluation Patient Details Name: Morgan Gregory MRN: [id number] DOB: Dec 15, 1940 Today's Date: 03/20/2015    History of Present Illness L TKR   Clinical Impression   Pt admitted with the above diagnosis and has the deficits listed below. Pt would benefit from one more session of OT before leaving for SNF to address shower transfers.  Pt should do well at SNF rehab and then d/c home alone safely.    Follow Up Recommendations  SNF;Supervision/Assistance - 24 hour    Equipment Recommendations  None recommended by OT    Recommendations for Other Services       Precautions / Restrictions Precautions Precautions: Knee;Fall Restrictions Weight Bearing Restrictions: No Other Position/Activity Restrictions: WBAT      Mobility Bed Mobility Overal bed mobility: Needs Assistance Bed Mobility: Supine to Sit     Supine to sit: Min assist     General bed mobility comments: in chair on arrival.  Transfers Overall transfer level: Needs assistance Equipment used: Rolling walker (2 wheeled) Transfers: Sit to/from Stand Sit to Stand: Min guard         General transfer comment: Cues for hand placement    Balance Overall balance assessment: Needs assistance Sitting-balance support: Feet supported Sitting balance-Leahy Scale: Good     Standing balance support: Bilateral upper extremity supported;During functional activity Standing balance-Leahy Scale: Fair Standing balance comment: Pt stood at sink and groomed for 3-5 minutes holding to walker at times and without holding walker at times.                            ADL Overall ADL's : Needs assistance/impaired Eating/Feeding: Independent;Sitting   Grooming: Wash/dry hands;Wash/dry face;Oral care;Supervision/safety;Standing   Upper Body Bathing: Set up;Sitting   Lower Body Bathing: Min guard;Sit to/from stand Lower Body Bathing Details (indicate cue type and reason): min guard when  letting go of walker to bathe peri area Upper Body Dressing : Set up;Sitting   Lower Body Dressing: Minimal assistance;Sit to/from stand Lower Body Dressing Details (indicate cue type and reason): min assist to donn and doff L sock and shoes only.  Pt instructed on dressing techniques. Toilet Transfer: Min guard;Comfort height toilet;Grab TEFL teacher Details (indicate cue type and reason): 3:1 over commode to simulate home.   Toileting- Clothing Manipulation and Hygiene: Supervision/safety;Sitting/lateral lean   Tub/ Shower Transfer: Copy Details (indicate cue type and reason): instructed provided.  Pt declined practicing bc knee started to hurt after bathroom tasks. Functional mobility during ADLs: Min guard;Rolling walker General ADL Comments: Pt did well with adls and is mobilizing well.  Pt needs assist  with L LE adls.     Vision Vision Assessment?: No apparent visual deficits   Perception     Praxis      Pertinent Vitals/Pain Pain Assessment: 0-10 Pain Score: 3  Pain Location: L knee Pain Descriptors / Indicators: Aching;Sore Pain Intervention(s): Limited activity within patient's tolerance;Premedicated before session;Repositioned;Monitored during session;Ice applied     Hand Dominance Right   Extremity/Trunk Assessment Upper Extremity Assessment Upper Extremity Assessment: Overall WFL for tasks assessed   Lower Extremity Assessment Lower Extremity Assessment: Defer to PT evaluation LLE Deficits / Details: 3/5 quads with pt performing IND SLR and AAROM at knee -10 - 90   Cervical / Trunk Assessment Cervical / Trunk Assessment: Normal   Communication Communication Communication: No difficulties   Cognition Arousal/Alertness: Awake/alert Behavior During Therapy: WFL for tasks assessed/performed Overall  Cognitive Status: Within Functional Limits for tasks assessed                     General Comments        Exercises Exercises: Total Joint     Shoulder Instructions      Home Living Family/patient expects to be discharged to:: Skilled nursing facility Living Arrangements: Alone                                      Prior Functioning/Environment Level of Independence: Independent             OT Diagnosis: Generalized weakness;Acute pain   OT Problem List: Impaired balance (sitting and/or standing);Decreased knowledge of use of DME or AE;Pain   OT Treatment/Interventions: Self-care/ADL training;Therapeutic activities;DME and/or AE instruction    OT Goals(Current goals can be found in the care plan section) Acute Rehab OT Goals Patient Stated Goal: Resume previous lifestyle with decreased pain OT Goal Formulation: With patient Time For Goal Achievement: 03/27/15 Potential to Achieve Goals: Good ADL Goals Pt Will Perform Tub/Shower Transfer: Shower transfer;shower seat;rolling walker;with supervision Additional ADL Goal #1: Pt will walk to bathroom with rolling walker and complete all toileting tasks with 3:1 over commode with S.  OT Frequency: Min 2X/week   Barriers to D/C: Decreased caregiver support  lives alone       Co-evaluation              End of Session Equipment Utilized During Treatment: Surveyor, mining Communication: Mobility status  Activity Tolerance: Patient tolerated treatment well Patient left: in chair;with call bell/phone within reach;with family/visitor present   Time: 1022-1057 OT Time Calculation (min): 35 min Charges:  OT General Charges $OT Visit: 1 Procedure OT Evaluation $Initial OT Evaluation Tier I: 1 Procedure OT Treatments $Self Care/Home Management : 8-22 mins G-Codes:    Glenford Peers 16-Apr-2015, 11:07 AM  [phone]

## 2015-03-20 NOTE — Clinical Social Work Placement (Signed)
   CLINICAL SOCIAL WORK PLACEMENT  NOTE  Date:  03/20/2015  Patient Details  Name: Morgan Gregory MRN: [id number] Date of Birth: August 07, 1940  Clinical Social Work is seeking post-discharge placement for this patient at the [place] level of care (*CSW will initial, date and re-position this form in  chart as items are completed):  No   Patient/family provided with [place] Work Department's list of facilities offering this level of care within the geographic area requested by the patient (or if unable, by the patient's family).  Yes   Patient/family informed of their freedom to choose among providers that offer the needed level of care, that participate in Medicare, Medicaid or managed care program needed by the patient, have an available bed and are willing to accept the patient.  No   Patient/family informed of [place]'s ownership interest in [facility] and [facility], as well as of the fact that they are under no obligation to receive care at these facilities.  PASRR submitted to EDS on 03/20/15     PASRR number received on 03/20/15     Existing PASRR number confirmed on       FL2 transmitted to all facilities in geographic area requested by pt/family on 03/20/15     FL2 transmitted to all facilities within larger geographic area on       Patient informed that his/her managed care company has contracts with or will negotiate with certain facilities, including the following:        Yes   Patient/family informed of bed offers received.  Patient chooses bed at [facility]     Physician recommends and patient chooses bed at      Patient to be transferred to [facility] on  .  Patient to be transferred to facility by       Patient family notified on   of transfer.  Name of family member notified:        PHYSICIAN       Additional Comment:    _______________________________________________ Luretha Rued, [place]   [phone] 03/20/2015, 9:49 AM

## 2015-03-21 LAB — BASIC METABOLIC PANEL
Anion gap: 5 (ref 5–15)
BUN: 13 mg/dL (ref 6–20)
CHLORIDE: 106 mmol/L (ref 101–111)
CO2: 26 mmol/L (ref 22–32)
CREATININE: 0.62 mg/dL (ref 0.44–1.00)
Calcium: 8.3 mg/dL — ABNORMAL LOW (ref 8.9–10.3)
GFR calc non Af Amer: >60 mL/min (ref >60)
Glucose, Bld: 127 mg/dL — ABNORMAL HIGH (ref 65–99)
Potassium: 4.2 mmol/L (ref 3.5–5.1)
Sodium: 137 mmol/L (ref 135–145)

## 2015-03-21 LAB — CBC
HEMATOCRIT: 35.7 % — AB (ref 36.0–46.0)
HEMOGLOBIN: 11.8 g/dL — AB (ref 12.0–15.0)
MCH: 31 pg (ref 26.0–34.0)
MCHC: 33.1 g/dL (ref 30.0–36.0)
MCV: 93.7 fL (ref 78.0–100.0)
Platelets: 211 10*3/uL (ref 150–400)
RBC: 3.81 MIL/uL — ABNORMAL LOW (ref 3.87–5.11)
RDW: 12.4 % (ref 11.5–15.5)
WBC: 9.6 10*3/uL (ref 4.0–10.5)

## 2015-03-21 NOTE — Progress Notes (Signed)
     Subjective: 2 Days Post-Op Procedure(s) (LRB): LEFT TOTAL KNEE ARTHROPLASTY (Left)   Patient reports pain as mild, pain well controlled. No events throughout the night.  Ready to be discharged to SNF.  Objective:   VITALS:   Filed Vitals:   03/20/15 2050 03/21/15 0458  BP: 114/77 121/65  Pulse: 60 67  Temp: 98.2 F (36.8 C) 97.7 F (36.5 C)  Resp: 16 16    Dorsiflexion/Plantar flexion intact Incision: dressing C/D/I No cellulitis present Compartment soft  LABS  Recent Labs  03/20/15 0520 03/21/15 0415  HGB 12.0 11.8*  HCT 36.3 35.7*  WBC 9.8 9.6  PLT 183 211     Recent Labs  03/20/15 0520 03/21/15 0415  NA 136 137  K 4.3 4.2  BUN 10 13  CREATININE 0.48 0.62  GLUCOSE 143* 127*     Assessment/Plan: 2 Days Post-Op Procedure(s) (LRB): LEFT TOTAL KNEE ARTHROPLASTY (Left) Up with therapy Discharge to SNF  Follow up in 2 weeks at [facility]. Follow up with OLIN,Cisco Kindt D in 2 weeks.  Contact information:  [facility] [address] [place]   PAC  03/21/2015, 8:17 AM

## 2015-03-21 NOTE — Progress Notes (Signed)
Called supervisor at [place] back per her request and received her voicemail. Left message for her to call me back.

## 2015-03-21 NOTE — Clinical Social Work Placement (Signed)
   CLINICAL SOCIAL WORK PLACEMENT  NOTE  Date:  03/21/2015  Patient Details  Name: Morgan Gregory MRN: [id number] Date of Birth: [birth year]  Clinical Social Work is seeking post-discharge placement for this patient at the [place] level of care (*CSW will initial, date and re-position this form in  chart as items are completed):  No   Patient/family provided with [place] Work Department's list of facilities offering this level of care within the geographic area requested by the patient (or if unable, by the patient's family).  Yes   Patient/family informed of their freedom to choose among providers that offer the needed level of care, that participate in Medicare, Medicaid or managed care program needed by the patient, have an available bed and are willing to accept the patient.  No   Patient/family informed of [place]'s ownership interest in [facility] and [facility], as well as of the fact that they are under no obligation to receive care at these facilities.  PASRR submitted to EDS on 03/20/15     PASRR number received on 03/20/15     Existing PASRR number confirmed on       FL2 transmitted to all facilities in geographic area requested by pt/family on 03/20/15     FL2 transmitted to all facilities within larger geographic area on       Patient informed that his/her managed care company has contracts with or will negotiate with certain facilities, including the following:        Yes   Patient/family informed of bed offers received.  Patient chooses bed at [facility]     Physician recommends and patient chooses bed at      Patient to be transferred to [facility] on 03/21/15.  Patient to be transferred to facility by [place]     Patient family notified on 03/21/15 of transfer.  Name of family member notified:  Pt contacted her friend directly.     PHYSICIAN       Additional Comment: Pt is in agreement with d/c to  [facility] today. PT approved transport by car.[employer] Medicare has provided prior authorization. NSG reviewed d/c summary, scripts, AVS. Scripts included in d/c packet. D/C summary sent to SNF for review prior to pt's d/c. D/C packet provided to pt prior to d/c.   _______________________________________________ Luretha Rued, [place] 03/21/2015, 11:28 AM

## 2015-03-21 NOTE — Progress Notes (Signed)
Physical Therapy Treatment Patient Details Name: Morgan Gregory MRN: [id number] DOB: 29-Dec-1940 Today's Date: 04-15-2015    History of Present Illness L TKR    PT Comments    Pt progressing well with mobility and eager for progression to [facility]  Follow Up Recommendations  SNF     Equipment Recommendations  None recommended by PT    Recommendations for Other Services OT consult     Precautions / Restrictions Precautions Precautions: Knee;Fall Restrictions Weight Bearing Restrictions: No Other Position/Activity Restrictions: WBAT    Mobility  Bed Mobility               General bed mobility comments: Pt up in chair - declines back to bed  Transfers Overall transfer level: Needs assistance Equipment used: Rolling walker (2 wheeled) Transfers: Sit to/from Stand Sit to Stand: Supervision         General transfer comment: Cues for hand placement  Ambulation/Gait Ambulation/Gait assistance: Min guard Ambulation Distance (Feet): 100 Feet (Twice) Assistive device: Rolling walker (2 wheeled) Gait Pattern/deviations: Step-to pattern;Decreased step length - right;Decreased step length - left;Shuffle;Trunk flexed Gait velocity: decr   General Gait Details: cues for sequence, posture and position from [employer]            Wheelchair Mobility    Modified Rankin (Stroke Patients Only)       Balance Overall balance assessment: No apparent balance deficits (not formally assessed)   Sitting balance-Leahy Scale: Good       Standing balance-Leahy Scale: Fair                      Cognition Arousal/Alertness: Awake/alert Behavior During Therapy: WFL for tasks assessed/performed Overall Cognitive Status: Within Functional Limits for tasks assessed                      Exercises Total Joint Exercises Ankle Circles/Pumps: AROM;Both;15 reps;Supine Quad Sets: AROM;Both;Supine;20 reps Heel Slides: AAROM;Left;Supine;20  reps Straight Leg Raises: AAROM;AROM;Left;Supine;20 reps Long Arc Quad: AAROM;AROM;Left;10 reps;Seated Goniometric ROM: AAROM L knee -8 - 90    General Comments        Pertinent Vitals/Pain Pain Assessment: 0-10 Pain Score: 2  Pain Location: L knee Pain Descriptors / Indicators: Aching;Sore Pain Intervention(s): Limited activity within patient's tolerance;Monitored during session;Premedicated before session;Ice applied    Home Living                      Prior Function            PT Goals (current goals can now be found in the care plan section) Acute Rehab PT Goals Patient Stated Goal: Resume previous lifestyle with decreased pain PT Goal Formulation: With patient Time For Goal Achievement: 03/23/15 Potential to Achieve Goals: Good Progress towards PT goals: Progressing toward goals    Frequency  7X/week    PT Plan Current plan remains appropriate    Co-evaluation             End of Session Equipment Utilized During Treatment: Gait belt Activity Tolerance: Patient tolerated treatment well Patient left: in chair;with call bell/phone within reach     Time: 0935-1020 PT Time Calculation (min) (ACUTE ONLY): 45 min  Charges:  $Gait Training: 23-37 mins $Therapeutic Exercise: 8-22 mins                    G Codes:      Karlisa Gaubert 04/15/2015, 11:12 AM

## 2015-03-21 NOTE — Discharge Summary (Signed)
Physician Discharge Summary  Patient ID: Morgan Gregory MRN: [id number] DOB/AGE: 09-13-1940 74 y.o.  Admit date: 03/19/2015 Discharge date:  03/21/2015  Procedures:  Procedure(s) (LRB): LEFT TOTAL KNEE ARTHROPLASTY (Left)  Attending Physician:  Dr. Paralee Cancel   Admission Diagnoses:   Left knee primary OA / pain  Discharge Diagnoses:  Principal Problem:   S/P left TKA Active Problems:   S/P knee replacement  Past Medical History  Diagnosis Date  . Colon polyps   . IBS (irritable bowel syndrome)   . Gastritis   . Arthritis   . Cancer ([place]) 1968    melanoma on right shin  . Osteoporosis     osteopenia  . Thyroid disease     hypothyroid  . Hypothyroidism   . GERD (gastroesophageal reflux disease)     03-05-15 not an issue now  . 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 Edema     right lower leg/foot due to s/p melanoma excision    HPI:    Morgan Gregory, 74 y.o. female, has a history of pain and functional disability in the left knee due to arthritis and has failed non-surgical conservative treatments for greater than 12 weeks to include NSAID's and/or analgesics, corticosteriod injections, viscosupplementation injections and activity modification. Onset of symptoms was gradual, starting 4+ years ago with gradually worsening course since that time. The patient noted no past surgery on the left knee(s). Patient currently rates pain in the left knee(s) at 9 out of 10 with activity. Patient has worsening of pain with activity and weight bearing, pain that interferes with activities of daily living, pain with passive range of motion, crepitus and joint swelling. Patient has evidence of periarticular osteophytes and joint space narrowing by imaging studies. There is no active infection. Risks, benefits and expectations were discussed with the patient. Risks including but not limited to the risk of anesthesia, blood clots,  nerve damage, blood vessel damage, failure of the prosthesis, infection and up to and including death. Patient understand the risks, benefits and expectations and wishes to proceed with surgery.   PCP: Osborne Casco, MD   Discharged Condition: good  Hospital Course:  Patient underwent the above stated procedure on 03/19/2015. Patient tolerated the procedure well and brought to the recovery room in good condition and subsequently to the floor.  POD #1 BP: 105/62 ; Pulse: 60 ; Temp: 97.7 F (36.5 C) ; Resp: 16 Patient reports pain as mild, pain controlled. No events throughout the night.  Dorsiflexion/plantar flexion intact, incision: dressing C/D/I, no cellulitis present and compartment soft.   LABS  Basename    HGB     12.0  HCT     36.3   POD #2  BP: 121/65 ; Pulse: 67 ; Temp: 97.7 F (36.5 C) ; Resp: 16 Patient reports pain as mild, pain well controlled. No events throughout the night. Ready to be discharged to SNF. Dorsiflexion/plantar flexion intact, incision: dressing C/D/I, no cellulitis present and compartment soft.   LABS  Basename    HGB     11.8  HCT     35.7    Discharge Exam: General appearance: alert, cooperative and no distress Extremities: Homans sign is negative, no sign of DVT, no edema, redness or tenderness in the calves or thighs and no ulcers, gangrene or trophic changes  Disposition:  Skilled nursing facility with follow up in 2 weeks   Follow-up Information  Follow up with [name], MD. Schedule an appointment as soon as possible for a visit in 2 weeks.   Specialty:  Orthopedic Surgery   Contact information:   [address] [place] [address] [id number]       Discharge Instructions    Call MD / Call 911    Complete by:  As directed   If you experience chest pain or shortness of breath, CALL 911 and be transported to the hospital emergency room.  If you develope a fever above 101 F, pus (white drainage) or  increased drainage or redness at the wound, or calf pain, call your surgeon's office.     Change dressing    Complete by:  As directed   Maintain surgical dressing until follow up in the clinic. If the edges start to pull up, may reinforce with tape. If the dressing is no longer working, may remove and cover with gauze and tape, but must keep the area dry and clean.  Call with any questions or concerns.     Constipation Prevention    Complete by:  As directed   Drink plenty of fluids.  Prune juice may be helpful.  You may use a stool softener, such as Colace (over the counter) 100 mg twice a day.  Use MiraLax (over the counter) for constipation as needed.     Diet - low sodium heart healthy    Complete by:  As directed      Discharge instructions    Complete by:  As directed   Maintain surgical dressing until follow up in the clinic. If the edges start to pull up, may reinforce with tape. If the dressing is no longer working, may remove and cover with gauze and tape, but must keep the area dry and clean.  Follow up in 2 weeks at [facility]. Call with any questions or concerns.     Increase activity slowly as tolerated    Complete by:  As directed   Weight bearing as tolerated with assist device (walker, cane, etc) as directed, use it as long as suggested by your surgeon or therapist, typically at least 4-6 weeks.     TED hose    Complete by:  As directed   Use stockings (TED hose) for 2 weeks on both leg(s).  You may remove them at night for sleeping.             Medication List    TAKE these medications        aspirin 325 MG EC tablet  Take 1 tablet (325 mg total) by mouth 2 (two) times daily.     Calcium-Magnesium-Vitamin D 200-100-33.3 MG-MG-UNIT Caps  Take 1 capsule by mouth daily.     cholecalciferol 1000 UNITS tablet  Commonly known as:  VITAMIN D  Take 1,000 Units by mouth daily.     docusate sodium 100 MG capsule  Commonly known as:  COLACE  Take 1 capsule  (100 mg total) by mouth 2 (two) times daily.     ferrous sulfate 325 (65 FE) MG tablet  Take 1 tablet (325 mg total) by mouth 3 (three) times daily after meals.     HYDROcodone-acetaminophen 7.5-325 MG tablet  Commonly known as:  NORCO  Take 1-2 tablets by mouth every 4 (four) hours as needed for moderate pain.     levothyroxine 50 MCG tablet  Commonly known as:  SYNTHROID, LEVOTHROID  Take 50 mcg by mouth daily.  polyethylene glycol packet  Commonly known as:  MIRALAX / GLYCOLAX  Take 17 g by mouth 2 (two) times daily.     SYSTANE OP  Apply 1 drop to eye 2 (two) times daily as needed (dry eyes).     tiZANidine 4 MG tablet  Commonly known as:  ZANAFLEX  Take 1 tablet (4 mg total) by mouth every 6 (six) hours as needed for muscle spasms.     zolpidem 10 MG tablet  Commonly known as:  AMBIEN  Take 10 mg by mouth At bedtime as needed for sleep.         Signed: [place]. Jaskirat Schwieger   PA-C  03/21/2015, 8:20 AM

## 2015-03-21 NOTE — Progress Notes (Signed)
Spoke with supervisor at [facility] who was driving and unable to take report. She asked me to call her back in 30 minutes.

## 2015-03-21 NOTE — Progress Notes (Signed)
Occupational Therapy Treatment Patient Details Name: ILISHA BLUST MRN: [id number] DOB: 28-May-1940 Today's Date: 03/21/2015    History of present illness L TKR   OT comments  Pt doing well and met shower transfer goal.  Cont with current plan.  Follow Up Recommendations  SNF;Supervision/Assistance - 24 hour    Equipment Recommendations  None recommended by OT    Recommendations for Other Services      Precautions / Restrictions Precautions Precautions: Knee;Fall Restrictions Weight Bearing Restrictions: No Other Position/Activity Restrictions: WBAT       Mobility Bed Mobility               General bed mobility comments: pt in chair on arrival.  Transfers Overall transfer level: Needs assistance Equipment used: Rolling walker (2 wheeled) Transfers: Sit to/from Stand Sit to Stand: Supervision         General transfer comment: Cues for hand placement    Balance Overall balance assessment: No apparent balance deficits (not formally assessed)                                 ADL Overall ADL's : Needs assistance/impaired     Grooming: Wash/dry hands;Wash/dry face;Oral care;Supervision/safety;Standing                           Tub/ Shower Transfer: Walk-in shower;Supervision/safety;Cueing for sequencing;Ambulation;Rolling walker Tub/Shower Transfer Details (indicate cue type and reason): No hands on assist needed. Functional mobility during ADLs: Supervision/safety;Rolling walker General ADL Comments: Pt with increased independence with functional adl transfers today.      Vision                     Perception     Praxis      Cognition   Behavior During Therapy: WFL for tasks assessed/performed Overall Cognitive Status: Within Functional Limits for tasks assessed                       Extremity/Trunk Assessment               Exercises     Shoulder Instructions       General Comments       Pertinent Vitals/ Pain       Pain Assessment: 0-10 Pain Score: 2  Pain Location: l knee Pain Descriptors / Indicators: Sore Pain Intervention(s): Limited activity within patient's tolerance;Repositioned;Monitored during session;Ice applied  Home Living                                          Prior Functioning/Environment              Frequency Min 2X/week     Progress Toward Goals  OT Goals(current goals can now be found in the care plan section)  Progress towards OT goals: Progressing toward goals  Acute Rehab OT Goals Patient Stated Goal: Resume previous lifestyle with decreased pain OT Goal Formulation: With patient Time For Goal Achievement: 03/27/15 Potential to Achieve Goals: Good ADL Goals Pt Will Perform Tub/Shower Transfer: Shower transfer;shower seat;rolling walker;with supervision Additional ADL Goal #1: Pt will walk to bathroom with rolling walker and complete all toileting tasks with 3:1 over commode with S.  Plan Discharge plan remains appropriate    Co-evaluation  End of Session Equipment Utilized During Treatment: Rolling walker   Activity Tolerance Patient tolerated treatment well   Patient Left in chair;with call bell/phone within reach;with family/visitor present   Nurse Communication Mobility status        Time: 9969-2493 OT Time Calculation (min): 16 min  Charges: OT Treatments $Self Care/Home Management : 8-22 mins  Glenford Peers 03/21/2015, 10:36 AM [phone]

## 2015-03-22 ENCOUNTER — Non-Acute Institutional Stay (SKILLED_NURSING_FACILITY): Payer: Medicare Other | Admitting: Adult Health

## 2015-03-22 DIAGNOSIS — D62 Acute posthemorrhagic anemia: Secondary | ICD-10-CM | POA: Diagnosis not present

## 2015-03-22 DIAGNOSIS — K5900 Constipation, unspecified: Secondary | ICD-10-CM

## 2015-03-22 DIAGNOSIS — E039 Hypothyroidism, unspecified: Secondary | ICD-10-CM

## 2015-03-22 DIAGNOSIS — G4700 Insomnia, unspecified: Secondary | ICD-10-CM

## 2015-03-22 DIAGNOSIS — M1712 Unilateral primary osteoarthritis, left knee: Secondary | ICD-10-CM

## 2015-03-23 ENCOUNTER — Encounter: Payer: Self-pay | Admitting: Adult Health

## 2015-03-23 NOTE — Progress Notes (Signed)
This encounter was created in error - please disregard.  This encounter was created in error - please disregard.

## 2015-03-24 ENCOUNTER — Encounter: Payer: Self-pay | Admitting: Adult Health

## 2015-03-24 NOTE — Progress Notes (Signed)
Patient ID: Morgan Gregory, female   DOB: August 11, 1940, 74 y.o.   MRN: [id number]    DATE:   03/22/15   MRN:  [id number]  BIRTHDAY: 11/21/1940  Facility:  Nursing Home Location:  [place] Room Number: 807-P  LEVEL OF CARE:  SNF (31)  Contact Information    Name Relation Home Work [place] Relative [phone]        Chief Complaint  Patient presents with  . Hospitalization Follow-up    Osteoarthritis S/P left total knee arthroplasty, hypothyroidism, insomnia, anemia and constipation    HISTORY OF PRESENT ILLNESS:  This is a 74 year old female who was been admitted to [facility] on 03/21/15 from [facility]. She has PMH of IBS, colon polyps, right shin melanoma, osteoporosis, hypothyroidism, GERD, hiatal hernia, neuropathy and edema of right foot/leg due to melanoma excision. She has osteoarthritis of left knee S/P left total knee arthroplasty on 03/19/15.  She has been admitted for a short-term rehabilitation.   PAST MEDICAL HISTORY:  Past Medical History  Diagnosis Date  . Colon polyps   . IBS (irritable bowel syndrome)   . Gastritis   . Arthritis   . Cancer ([place]) 1968    melanoma on right shin  . Osteoporosis     osteopenia  . Thyroid disease     hypothyroid  . Hypothyroidism   . GERD (gastroesophageal reflux disease)     03-05-15 not an issue now  . 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 Edema     right lower leg/foot due to s/p melanoma excision     CURRENT MEDICATIONS: Reviewed  Patient's Medications  New Prescriptions   No medications on file  Previous Medications   ASPIRIN EC 325 MG EC TABLET    Take 1 tablet (325 mg total) by mouth 2 (two) times daily.   CALCIUM-MAGNESIUM-VITAMIN D 200-100-33.3 MG-MG-UNIT CAPS    Take 1 capsule by mouth daily.   CHOLECALCIFEROL (VITAMIN D) 1000 UNITS TABLET    Take 1,000 Units by mouth daily.   HYDROCODONE-ACETAMINOPHEN (NORCO) 7.5-325 MG TABLET    Take 1-2 tablets by mouth every 4 (four) hours as needed for moderate pain.   LEVOTHYROXINE (SYNTHROID, LEVOTHROID) 50 MCG TABLET    Take 50 mcg by mouth daily.    POLYETHYL GLYCOL-PROPYL GLYCOL (SYSTANE OP)    Apply 1 drop to eye 2 (two) times daily as needed (dry eyes).   POLYETHYLENE GLYCOL (MIRALAX / GLYCOLAX) PACKET    Take 17 g by mouth 2 (two) times daily.   SENNOSIDES-DOCUSATE SODIUM (SENOKOT-S) 8.6-50 MG TABLET    Take 2 tablets by mouth 2 (two) times daily.   TIZANIDINE (ZANAFLEX) 4 MG TABLET    Take 1 tablet (4 mg total) by mouth every 6 (six) hours as needed for muscle spasms.   ZOLPIDEM (AMBIEN) 10 MG TABLET    Take 10 mg by mouth At bedtime as needed for sleep.   Modified Medications   No medications on file  Discontinued Medications   DOCUSATE SODIUM (COLACE) 100 MG CAPSULE    Take 1 capsule (100 mg total) by mouth 2 (two) times daily.   FERROUS SULFATE 325 (65 FE) MG TABLET    Take 1 tablet (325 mg total) by mouth 3 (three) times daily after meals.     Allergies  Allergen Reactions  . Augmentin [Amoxicillin-Pot Clavulanate] Diarrhea and Nausea Only  Has patient had a PCN reaction causing immediate rash, facial/tongue/throat swelling, SOB or lightheadedness with hypotension: No Has patient had a PCN reaction causing severe rash involving mucus membranes or skin necrosis: No Has patient had a PCN reaction that required hospitalization No Has patient had a PCN reaction occurring within the last 10 years: No If all of the above answers are "NO", then may proceed with Cephalosporin use.   . Erythromycin Diarrhea and Nausea Only    Severe diarrhea     REVIEW OF SYSTEMS:  GENERAL: no change in appetite, no fatigue, no weight changes, no fever, chills or weakness EYES: Denies change in vision, dry eyes, eye pain, itching or discharge EARS: Denies change in hearing, ringing in ears, or earache NOSE: Denies nasal  congestion or epistaxis MOUTH and THROAT: Denies oral discomfort, gingival pain or bleeding, pain from teeth or hoarseness   RESPIRATORY: no cough, SOB, DOE, wheezing, hemoptysis CARDIAC: no chest pain, edema or palpitations GI: no abdominal pain, diarrhea, heart burn, nausea or vomiting, +constipation GU: Denies dysuria, frequency, hematuria, incontinence, or discharge PSYCHIATRIC: Denies feeling of depression or anxiety. No report of hallucinations, insomnia, paranoia, or agitation   PHYSICAL EXAMINATION  GENERAL APPEARANCE: Well nourished. In no acute distress. Normal body habitus SKIN:  Left knee surgical incision has Aquacel dressing, dry, no erythema HEAD: Normal in size and contour. No evidence of trauma EYES: Lids open and close normally. No blepharitis, entropion or ectropion. PERRL. Conjunctivae are clear and sclerae are white. Lenses are without opacity EARS: Pinnae are normal. Patient hears normal voice tunes of the examiner MOUTH and THROAT: Lips are without lesions. Oral mucosa is moist and without lesions. Tongue is normal in shape, size, and color and without lesions NECK: supple, trachea midline, no neck masses, no thyroid tenderness, no thyromegaly LYMPHATICS: no LAN in the neck, no supraclavicular LAN RESPIRATORY: breathing is even & unlabored, BS CTAB CARDIAC: RRR, no murmur,no extra heart sounds, no edema GI: abdomen soft, normal BS, no masses, no tenderness, no hepatomegaly, no splenomegaly EXTREMITIES:  Able to move 4 extremities PSYCHIATRIC: Alert and oriented X 3. Affect and behavior are appropriate  LABS/RADIOLOGY: Labs reviewed: Basic Metabolic Panel:  Recent Labs  03/05/15 1125 03/20/15 0520 03/21/15 0415  NA 140 136 137  K 5.2* 4.3 4.2  CL 104 106 106  CO2 29 25 26   GLUCOSE 99 143* 127*  BUN 15 10 13   CREATININE 0.74 0.48 0.62  CALCIUM 9.5 8.2* 8.3*   CBC:  Recent Labs  03/05/15 1125 03/20/15 0520 03/21/15 0415  WBC 7.6 9.8 9.6  HGB  15.4* 12.0 11.8*  HCT 46.0 36.3 35.7*  MCV 92.4 91.4 93.7  PLT 217 183 211     ASSESSMENT/PLAN:  Osteoarthritis S/P left total knee arthroplasty - for rehabilitation; LLE WBAT; continue aspirin 325 mg EC 1 tab by mouth twice a day for DVT prophylaxis; Norco 5/325 mg 1-2 tabs by mouth every 4 hours when necessary for pain; tizanidine 4 mg 1 tab by mouth every 6 hours when necessary; follow-up with Dr. Alvan Dame, orthopedic surgeon, in 2 weeks; check CMP   Hypothyroidism - continue Synthroid 50 g 1 tab by mouth daily  Insomnia - continue Ambien 10 mg 1 tab by mouth daily at bedtime when necessary  Anemia, acute blood loss - hemoglobin 11.8; discontinue ferrous sulfate patient's request since it is making her constipated; check CBC  Constipation - discontinue Colace and start senna S 2 tabs by mouth twice a day and MiraLAX  17 g by mouth twice a day      Goals of care:  Short-term rehabilitation    [facility], NP [place]

## 2015-03-25 ENCOUNTER — Non-Acute Institutional Stay (SKILLED_NURSING_FACILITY): Payer: Medicare Other | Admitting: Internal Medicine

## 2015-03-25 ENCOUNTER — Encounter: Payer: Self-pay | Admitting: Internal Medicine

## 2015-03-25 DIAGNOSIS — D62 Acute posthemorrhagic anemia: Secondary | ICD-10-CM | POA: Diagnosis not present

## 2015-03-25 DIAGNOSIS — G4700 Insomnia, unspecified: Secondary | ICD-10-CM | POA: Diagnosis not present

## 2015-03-25 DIAGNOSIS — E039 Hypothyroidism, unspecified: Secondary | ICD-10-CM

## 2015-03-25 DIAGNOSIS — M1712 Unilateral primary osteoarthritis, left knee: Secondary | ICD-10-CM | POA: Diagnosis not present

## 2015-03-25 DIAGNOSIS — R2681 Unsteadiness on feet: Secondary | ICD-10-CM

## 2015-03-25 DIAGNOSIS — R600 Localized edema: Secondary | ICD-10-CM

## 2015-03-25 DIAGNOSIS — K5900 Constipation, unspecified: Secondary | ICD-10-CM | POA: Diagnosis not present

## 2015-03-25 NOTE — Progress Notes (Signed)
Patient ID: Morgan Gregory, female   DOB: [birth year], 74 y.o.   MRN: [id number]     [place]  PCP: Osborne Casco, MD  Code Status: Full Code   Allergies  Allergen Reactions  . Augmentin [Amoxicillin-Pot Clavulanate] Diarrhea and Nausea Only    Has patient had a PCN reaction causing immediate rash, facial/tongue/throat swelling, SOB or lightheadedness with hypotension: No Has patient had a PCN reaction causing severe rash involving mucus membranes or skin necrosis: No Has patient had a PCN reaction that required hospitalization No Has patient had a PCN reaction occurring within the last 10 years: No If all of the above answers are "NO", then may proceed with Cephalosporin use.   . Erythromycin Diarrhea and Nausea Only    Severe diarrhea    Chief Complaint  Patient presents with  . New Admit To SNF    New Admission      HPI:  74 y.o. patient is here for short term rehabilitation post hospital admission from 03/19/15-03/21/15 with left knee OA. She underwent left total knee arthroplasty. She is seen in her room today. Her pain is under control with current regimen. She was started on bowel regimen few days back and this has been helpful. She has noticed increased swelling to her left leg with discomfort in the calf area.   Review of Systems:  Constitutional: Negative for fever, chills, diaphoresis.  HENT: Negative for headache, congestion, nasal discharge.   Eyes: Negative for eye pain, blurred vision, double vision and discharge.  Respiratory: Negative for cough, shortness of breath and wheezing.   Cardiovascular: Negative for chest pain, palpitations. Gastrointestinal: Negative for heartburn, nausea, vomiting, abdominal pain. Had bowel movement yesterday Genitourinary: Negative for dysuria and flank pain.  Musculoskeletal: Negative for back pain, falls. Skin: Negative for itching, rash.  Neurological: Negative for dizziness, tingling, focal  weakness Psychiatric/Behavioral: Negative for depression    Past Medical History  Diagnosis Date  . Colon polyps   . IBS (irritable bowel syndrome)   . Gastritis   . Arthritis   . Cancer ([place]) 1968    melanoma on right shin  . Osteoporosis     osteopenia  . Thyroid disease     hypothyroid  . Hypothyroidism   . GERD (gastroesophageal reflux disease)     03-05-15 not an issue now  . 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 Edema     right lower leg/foot due to s/p melanoma excision   Past Surgical History  Procedure Laterality Date  . Melanoma excision  1968    right shin/calf- calf measures 0.75 inches larger than left  . Abdominal hysterectomy  1979  . Foot surgery Bilateral     hammertoe, neuromas, bunionectomy  . Thyroidectomy, partial  2011  . Cervical spine surgery  2007    posterior approach-no retained hardware  . Cataract extraction, bilateral      8'15,. 9'15  . Tonsillectomy    . Total knee arthroplasty Left 03/19/2015    Procedure: LEFT TOTAL KNEE ARTHROPLASTY;  Surgeon: Paralee Cancel, MD;  Location: [facility];  Service: Orthopedics;  Laterality: Left;   Social History:   reports that she quit smoking about 50 years ago. Her smoking use included Cigarettes. She has never used smokeless tobacco. She reports that she drinks about 4.2 oz of alcohol per week. She reports that she does not use illicit drugs.  Family History  Problem Relation  Age of Onset  . Colon cancer Mother     Medications:   Medication List       This list is accurate as of: 03/25/15 10:47 AM.  Always use your most recent med list.               aspirin 325 MG EC tablet  Take 1 tablet (325 mg total) by mouth 2 (two) times daily.     Calcium-Magnesium-Vitamin D 200-100-33.3 MG-MG-UNIT Caps  Take 1 capsule by mouth daily.     cholecalciferol 1000 UNITS tablet  Commonly known as:  VITAMIN D  Take 1,000 Units by mouth daily.     ferrous  sulfate 325 (65 FE) MG tablet  Take 325 mg by mouth 2 (two) times daily with a meal.     HYDROcodone-acetaminophen 7.5-325 MG tablet  Commonly known as:  NORCO  Take 1-2 tablets by mouth every 4 (four) hours as needed for moderate pain.     levothyroxine 50 MCG tablet  Commonly known as:  SYNTHROID, LEVOTHROID  Take 50 mcg by mouth daily.     polyethylene glycol packet  Commonly known as:  MIRALAX / GLYCOLAX  Take 17 g by mouth 2 (two) times daily.     sennosides-docusate sodium 8.6-50 MG tablet  Commonly known as:  SENOKOT-S  Take 2 tablets by mouth 2 (two) times daily.     SYSTANE OP  Apply 1 drop to eye 2 (two) times daily as needed (dry eyes).     tiZANidine 4 MG tablet  Commonly known as:  ZANAFLEX  Take 1 tablet (4 mg total) by mouth every 6 (six) hours as needed for muscle spasms.     zolpidem 10 MG tablet  Commonly known as:  AMBIEN  Take 10 mg by mouth At bedtime as needed for sleep.         Physical Exam:  Filed Vitals:   03/25/15 1041  BP: 132/68  Pulse: 70  Temp: 98.5 F (36.9 C)  TempSrc: Oral  Resp: 18  Height: 5\' 4"  (1.626 m)  Weight: 139 lb (63.05 kg)  SpO2: 98%    General- elderly female, well built, in no acute distress Head- normocephalic, atraumatic Nose- normal nasal mucosa, no maxillary or frontal sinus tenderness, no nasal discharge Throat- moist mucus membrane  Eyes- PERRLA, EOMI, no pallor, no icterus, no discharge, normal conjunctiva, normal sclera Neck- no cervical lymphadenopathy Cardiovascular- normal s1,s2, no murmurs, palpable dorsalis pedis and radial pulses, trace right leg and 2+ left leg edema Respiratory- bilateral clear to auscultation, no wheeze, no rhonchi, no crackles, no use of accessory muscles Abdomen- bowel sounds present, soft, non tender Musculoskeletal- able to move all 4 extremities, limited left knee ROM Neurological- no focal deficit, alert and oriented to person, place and time Skin- warm and dry, left knee  surgical incision with aquacel dressing in place, ted hose in place Psychiatry- normal mood and affect    Labs reviewed: Basic Metabolic Panel:  Recent Labs  03/05/15 1125 03/20/15 0520 03/21/15 0415  NA 140 136 137  K 5.2* 4.3 4.2  CL 104 106 106  CO2 29 25 26   GLUCOSE 99 143* 127*  BUN 15 10 13   CREATININE 0.74 0.48 0.62  CALCIUM 9.5 8.2* 8.3*   CBC:  Recent Labs  03/05/15 1125 03/20/15 0520 03/21/15 0415  WBC 7.6 9.8 9.6  HGB 15.4* 12.0 11.8*  HCT 46.0 36.3 35.7*  MCV 92.4 91.4 93.7  PLT 217 183 211  Assessment/Plan  Unsteady gait Post left knee replacement surgery. Will have patient work with PT/OT as tolerated to regain strength and restore function.  Fall precautions are in place.  Left leg swelling 2+ edema and per patient has increased over a day. Complaints of discomfort with the edema. Currently only on aspirin for dvt prophylaxis. Get venous doppler to rule out DVT. Hold therapy until dvt has been ruled out.  Left knee Osteoarthritis  S/P left total knee arthroplasty. LLE WBAT. has f/u with orthopedics. Continue aspirin ec 325 mg bid for dvt prophylaxis. Continue Norco 5/325 mg 1-2 tabs q4h prn pain and tizanidine 4 mg q6h prn muscle spasm. Will have her work with physical therapy and occupational therapy team to help with gait training and muscle strengthening exercises.fall precautions. Skin care. Encourage to be out of bed. Continue calcium and vitamin d supplement  Constipation Improved. Continue senna s but change to 2 tab qhs and change miralax to daily as well. Hydration encouraged.  Blood loss anemia Post op, monitor cbc. Off iron per patient request.  Hypothyroidism  continue Synthroid 50 mcg daily  Insomnia continue Ambien 10 mg daily prn and monitor    Goals of care: short term rehabilitation   Labs/tests ordered: cbc, cmp pending  Family/ staff Communication: reviewed care plan with patient and nursing supervisor    Blanchie Serve, MD  [facility] [phone] (Monday-Friday 8 am - 5 pm) [phone] (afterhours)

## 2015-03-29 ENCOUNTER — Encounter: Payer: Self-pay | Admitting: Adult Health

## 2015-04-02 ENCOUNTER — Ambulatory Visit (HOSPITAL_COMMUNITY)
Admission: RE | Admit: 2015-04-02 | Discharge: 2015-04-02 | Disposition: A | Discharge status: Home / Self Care | Payer: Medicare Other | Source: Ambulatory Visit | Attending: Orthopedic Surgery | Admitting: Orthopedic Surgery

## 2015-04-02 ENCOUNTER — Other Ambulatory Visit (HOSPITAL_COMMUNITY): Payer: Self-pay | Admitting: Physician Assistant

## 2015-04-02 DIAGNOSIS — M79609 Pain in unspecified limb: Secondary | ICD-10-CM | POA: Insufficient documentation

## 2015-04-02 DIAGNOSIS — R52 Pain, unspecified: Secondary | ICD-10-CM

## 2015-04-02 NOTE — Progress Notes (Signed)
*  Preliminary Results* Left lower extremity venous duplex completed. Left lower extremity is negative for deep vein thrombosis. There is no evidence of left Baker's cyst.    04/02/2015 5:32 PM  Maudry Mayhew, RVT, RDCS, RDMS

## 2015-06-11 ENCOUNTER — Other Ambulatory Visit: Payer: Self-pay

## 2015-06-11 DIAGNOSIS — Z1231 Encounter for screening mammogram for malignant neoplasm of breast: Secondary | ICD-10-CM

## 2015-06-18 DIAGNOSIS — Z85820 Personal history of malignant melanoma of skin: Secondary | ICD-10-CM | POA: Diagnosis not present

## 2015-06-18 DIAGNOSIS — I788 Other diseases of capillaries: Secondary | ICD-10-CM | POA: Diagnosis not present

## 2015-06-18 DIAGNOSIS — L821 Other seborrheic keratosis: Secondary | ICD-10-CM | POA: Diagnosis not present

## 2015-06-18 DIAGNOSIS — D485 Neoplasm of uncertain behavior of skin: Secondary | ICD-10-CM | POA: Diagnosis not present

## 2015-06-18 DIAGNOSIS — D2272 Melanocytic nevi of left lower limb, including hip: Secondary | ICD-10-CM | POA: Diagnosis not present

## 2015-06-18 DIAGNOSIS — L812 Freckles: Secondary | ICD-10-CM | POA: Diagnosis not present

## 2015-07-09 ENCOUNTER — Other Ambulatory Visit: Payer: Self-pay | Admitting: Adult Health

## 2015-07-16 ENCOUNTER — Ambulatory Visit: Payer: Medicare Other

## 2015-07-23 DIAGNOSIS — E039 Hypothyroidism, unspecified: Secondary | ICD-10-CM | POA: Diagnosis not present

## 2015-07-29 DIAGNOSIS — H6693 Otitis media, unspecified, bilateral: Secondary | ICD-10-CM | POA: Diagnosis not present

## 2015-08-05 ENCOUNTER — Ambulatory Visit
Admission: RE | Admit: 2015-08-05 | Discharge: 2015-08-05 | Disposition: A | Discharge status: Home / Self Care | Payer: Medicare Other | Source: Ambulatory Visit

## 2015-08-05 DIAGNOSIS — Z1231 Encounter for screening mammogram for malignant neoplasm of breast: Secondary | ICD-10-CM | POA: Diagnosis not present

## 2015-08-05 DIAGNOSIS — H6590 Unspecified nonsuppurative otitis media, unspecified ear: Secondary | ICD-10-CM | POA: Diagnosis not present

## 2015-10-16 DIAGNOSIS — Z471 Aftercare following joint replacement surgery: Secondary | ICD-10-CM | POA: Diagnosis not present

## 2015-10-16 DIAGNOSIS — Z96652 Presence of left artificial knee joint: Secondary | ICD-10-CM | POA: Diagnosis not present

## 2015-12-17 DIAGNOSIS — L720 Epidermal cyst: Secondary | ICD-10-CM | POA: Diagnosis not present

## 2015-12-17 DIAGNOSIS — L821 Other seborrheic keratosis: Secondary | ICD-10-CM | POA: Diagnosis not present

## 2015-12-17 DIAGNOSIS — Z85820 Personal history of malignant melanoma of skin: Secondary | ICD-10-CM | POA: Diagnosis not present

## 2015-12-17 DIAGNOSIS — D692 Other nonthrombocytopenic purpura: Secondary | ICD-10-CM | POA: Diagnosis not present

## 2015-12-17 DIAGNOSIS — L812 Freckles: Secondary | ICD-10-CM | POA: Diagnosis not present

## 2015-12-17 DIAGNOSIS — D2271 Melanocytic nevi of right lower limb, including hip: Secondary | ICD-10-CM | POA: Diagnosis not present

## 2015-12-17 DIAGNOSIS — D1801 Hemangioma of skin and subcutaneous tissue: Secondary | ICD-10-CM | POA: Diagnosis not present

## 2015-12-18 DIAGNOSIS — M810 Age-related osteoporosis without current pathological fracture: Secondary | ICD-10-CM | POA: Diagnosis not present

## 2015-12-18 DIAGNOSIS — Z0000 Encounter for general adult medical examination without abnormal findings: Secondary | ICD-10-CM | POA: Diagnosis not present

## 2015-12-18 DIAGNOSIS — N393 Stress incontinence (female) (male): Secondary | ICD-10-CM | POA: Diagnosis not present

## 2015-12-18 DIAGNOSIS — D34 Benign neoplasm of thyroid gland: Secondary | ICD-10-CM | POA: Diagnosis not present

## 2015-12-18 DIAGNOSIS — G4700 Insomnia, unspecified: Secondary | ICD-10-CM | POA: Diagnosis not present

## 2015-12-18 DIAGNOSIS — Z23 Encounter for immunization: Secondary | ICD-10-CM | POA: Diagnosis not present

## 2015-12-18 DIAGNOSIS — E039 Hypothyroidism, unspecified: Secondary | ICD-10-CM | POA: Diagnosis not present

## 2016-03-18 DIAGNOSIS — Z471 Aftercare following joint replacement surgery: Secondary | ICD-10-CM | POA: Diagnosis not present

## 2016-03-18 DIAGNOSIS — Z96652 Presence of left artificial knee joint: Secondary | ICD-10-CM | POA: Diagnosis not present

## 2016-04-10 DIAGNOSIS — M1711 Unilateral primary osteoarthritis, right knee: Secondary | ICD-10-CM | POA: Diagnosis not present

## 2016-04-10 DIAGNOSIS — M2391 Unspecified internal derangement of right knee: Secondary | ICD-10-CM | POA: Diagnosis not present

## 2016-04-10 DIAGNOSIS — M25561 Pain in right knee: Secondary | ICD-10-CM | POA: Diagnosis not present

## 2016-05-07 DIAGNOSIS — M25561 Pain in right knee: Secondary | ICD-10-CM | POA: Diagnosis not present

## 2016-05-27 DIAGNOSIS — M23300 Other meniscus derangements, unspecified lateral meniscus, right knee: Secondary | ICD-10-CM | POA: Diagnosis not present

## 2016-05-27 DIAGNOSIS — M1711 Unilateral primary osteoarthritis, right knee: Secondary | ICD-10-CM | POA: Diagnosis not present

## 2016-05-27 DIAGNOSIS — M25561 Pain in right knee: Secondary | ICD-10-CM | POA: Diagnosis not present

## 2016-05-27 DIAGNOSIS — M23303 Other meniscus derangements, unspecified medial meniscus, right knee: Secondary | ICD-10-CM | POA: Diagnosis not present

## 2016-06-02 DIAGNOSIS — L821 Other seborrheic keratosis: Secondary | ICD-10-CM | POA: Diagnosis not present

## 2016-06-02 DIAGNOSIS — L812 Freckles: Secondary | ICD-10-CM | POA: Diagnosis not present

## 2016-06-02 DIAGNOSIS — Z85820 Personal history of malignant melanoma of skin: Secondary | ICD-10-CM | POA: Diagnosis not present

## 2016-06-18 DIAGNOSIS — E038 Other specified hypothyroidism: Secondary | ICD-10-CM | POA: Diagnosis not present

## 2016-07-23 DIAGNOSIS — M23303 Other meniscus derangements, unspecified medial meniscus, right knee: Secondary | ICD-10-CM | POA: Diagnosis not present

## 2016-07-23 DIAGNOSIS — M1711 Unilateral primary osteoarthritis, right knee: Secondary | ICD-10-CM | POA: Diagnosis not present

## 2016-07-23 DIAGNOSIS — M25561 Pain in right knee: Secondary | ICD-10-CM | POA: Diagnosis not present

## 2016-07-23 DIAGNOSIS — M23300 Other meniscus derangements, unspecified lateral meniscus, right knee: Secondary | ICD-10-CM | POA: Diagnosis not present

## 2016-07-30 ENCOUNTER — Other Ambulatory Visit: Payer: Self-pay | Admitting: Family Medicine

## 2016-07-30 DIAGNOSIS — Z1231 Encounter for screening mammogram for malignant neoplasm of breast: Secondary | ICD-10-CM

## 2016-08-18 ENCOUNTER — Ambulatory Visit
Admission: RE | Admit: 2016-08-18 | Discharge: 2016-08-18 | Disposition: A | Discharge status: Home / Self Care | Payer: Medicare Other | Source: Ambulatory Visit | Attending: Family Medicine | Admitting: Family Medicine

## 2016-08-18 DIAGNOSIS — Z1231 Encounter for screening mammogram for malignant neoplasm of breast: Secondary | ICD-10-CM | POA: Diagnosis not present

## 2016-09-09 ENCOUNTER — Encounter: Payer: Self-pay | Admitting: Internal Medicine

## 2016-09-30 NOTE — Progress Notes (Signed)
This encounter was created in error - please disregard.

## 2016-10-29 DIAGNOSIS — M23303 Other meniscus derangements, unspecified medial meniscus, right knee: Secondary | ICD-10-CM | POA: Diagnosis not present

## 2016-10-29 DIAGNOSIS — M25561 Pain in right knee: Secondary | ICD-10-CM | POA: Diagnosis not present

## 2016-12-01 ENCOUNTER — Encounter: Payer: Self-pay | Admitting: Internal Medicine

## 2016-12-07 DIAGNOSIS — Z85820 Personal history of malignant melanoma of skin: Secondary | ICD-10-CM | POA: Diagnosis not present

## 2016-12-07 DIAGNOSIS — L821 Other seborrheic keratosis: Secondary | ICD-10-CM | POA: Diagnosis not present

## 2016-12-07 DIAGNOSIS — L812 Freckles: Secondary | ICD-10-CM | POA: Diagnosis not present

## 2017-01-18 DIAGNOSIS — K5730 Diverticulosis of large intestine without perforation or abscess without bleeding: Secondary | ICD-10-CM

## 2017-01-18 HISTORY — DX: Diverticulosis of large intestine without perforation or abscess without bleeding: K57.30

## 2017-01-20 ENCOUNTER — Ambulatory Visit (AMBULATORY_SURGERY_CENTER): Payer: Self-pay

## 2017-01-20 VITALS — Ht 63.0 in | Wt 135.0 lb

## 2017-01-20 DIAGNOSIS — Z0000 Encounter for general adult medical examination without abnormal findings: Secondary | ICD-10-CM | POA: Diagnosis not present

## 2017-01-20 DIAGNOSIS — M810 Age-related osteoporosis without current pathological fracture: Secondary | ICD-10-CM | POA: Diagnosis not present

## 2017-01-20 DIAGNOSIS — E039 Hypothyroidism, unspecified: Secondary | ICD-10-CM | POA: Diagnosis not present

## 2017-01-20 DIAGNOSIS — Z860100 Personal history of colon polyps, unspecified: Secondary | ICD-10-CM

## 2017-01-20 DIAGNOSIS — D34 Benign neoplasm of thyroid gland: Secondary | ICD-10-CM | POA: Diagnosis not present

## 2017-01-20 MED ORDER — SUPREP BOWEL PREP KIT 17.5-3.13-1.6 GM/177ML PO SOLN: 1.0000 | Freq: Once | ORAL | 0 refills | Status: AC

## 2017-01-20 NOTE — Progress Notes (Signed)
No allergies to eggs or soy No diet meds No home oxygen No past problems with anesthesia  Had MAC last time (during knee replacement) feels she was aware near end of surgery and this disturbed her  Declined emmi

## 2017-01-21 ENCOUNTER — Encounter: Payer: Self-pay | Admitting: Internal Medicine

## 2017-01-25 ENCOUNTER — Telehealth: Payer: Self-pay | Admitting: Internal Medicine

## 2017-01-25 NOTE — Telephone Encounter (Signed)
Notified patient that our office does not normally do the PA because the insurance may not cover it anyway. Patient understands.

## 2017-02-01 DIAGNOSIS — M1711 Unilateral primary osteoarthritis, right knee: Secondary | ICD-10-CM | POA: Diagnosis not present

## 2017-02-01 DIAGNOSIS — M23300 Other meniscus derangements, unspecified lateral meniscus, right knee: Secondary | ICD-10-CM | POA: Diagnosis not present

## 2017-02-01 DIAGNOSIS — M25561 Pain in right knee: Secondary | ICD-10-CM | POA: Diagnosis not present

## 2017-02-01 DIAGNOSIS — M23303 Other meniscus derangements, unspecified medial meniscus, right knee: Secondary | ICD-10-CM | POA: Diagnosis not present

## 2017-02-03 ENCOUNTER — Ambulatory Visit (AMBULATORY_SURGERY_CENTER): Payer: Medicare Other | Admitting: Internal Medicine

## 2017-02-03 ENCOUNTER — Encounter: Payer: Self-pay | Admitting: Internal Medicine

## 2017-02-03 VITALS — BP 135/60 | HR 56 | Temp 98.6°F | Resp 11 | Ht 63.0 in | Wt 135.0 lb

## 2017-02-03 DIAGNOSIS — Z800 Family history of malignant neoplasm of digestive organs: Secondary | ICD-10-CM | POA: Diagnosis not present

## 2017-02-03 DIAGNOSIS — Z86010 Personal history of colonic polyps: Secondary | ICD-10-CM

## 2017-02-03 MED ORDER — SODIUM CHLORIDE 0.9 % IV SOLN: 500.0000 mL | INTRAVENOUS | Status: DC

## 2017-02-03 NOTE — Op Note (Signed)
[place] Patient Name: Morgan Gregory Procedure Date: 02/03/2017 11:00 AM MRN: [id number] Endoscopist: Docia Chuck. Henrene Pastor , MD Age: 76 Referring MD:  Date of Birth: Sep 10, 1940 Gender: Female Account #: [id number] Procedure:                Colonoscopy Indications:              High risk colon cancer surveillance: Personal                            history of non-advanced adenoma. Also family                            history of colon cancer in a parent less than 80.                            Previous examinations 1993, 1997, 2002, 2008, and                            2013 Medicines:                Monitored Anesthesia Care Procedure:                Pre-Anesthesia Assessment:                           - Prior to the procedure, a History and Physical                            was performed, and patient medications and                            allergies were reviewed. The patient's tolerance of                            previous anesthesia was also reviewed. The risks                            and benefits of the procedure and the sedation                            options and risks were discussed with the patient.                            All questions were answered, and informed consent                            was obtained. Prior Anticoagulants: The patient has                            taken no previous anticoagulant or antiplatelet                            agents. ASA Grade Assessment: II - A patient with  mild systemic disease. After reviewing the risks                            and benefits, the patient was deemed in                            satisfactory condition to undergo the procedure.                           After obtaining informed consent, the colonoscope                            was passed under direct vision. Throughout the                            procedure, the patient's blood pressure, pulse, and                 oxygen saturations were monitored continuously. The                            Colonoscope was introduced through the anus and                            advanced to the the cecum, identified by                            appendiceal orifice and ileocecal valve. The                            ileocecal valve, appendiceal orifice, and rectum                            were photographed. The quality of the bowel                            preparation was excellent. The colonoscopy was                            performed without difficulty. The patient tolerated                            the procedure well. The bowel preparation used was                            SUPREP. Scope In: 11:54:00 AM Scope Out: 12:13:02 PM Scope Withdrawal Time: 0 hours 12 minutes 16 seconds  Total Procedure Duration: 0 hours 19 minutes 2 seconds  Findings:                 A few small-mouthed diverticula were found in the                            sigmoid colon.  The exam was otherwise without abnormality on                            direct and retroflexion views. Complications:            No immediate complications. Estimated blood loss:                            None. Estimated Blood Loss:     Estimated blood loss: none. Impression:               - Diverticulosis in the sigmoid colon.                           - The examination was otherwise normal on direct                            and retroflexion views.                           - No specimens collected. Recommendation:           - Repeat colonoscopy is not recommended for                            surveillance.                           - Patient has a contact number available for                            emergencies. The signs and symptoms of potential                            delayed complications were discussed with the                            patient. Return to normal activities tomorrow.                             Written discharge instructions were provided to the                            patient.                           - Resume previous diet.                           - Continue present medications. Docia Chuck. Henrene Pastor, MD 02/03/2017 12:24:06 PM This report has been signed electronically.

## 2017-02-03 NOTE — Progress Notes (Signed)
No changes in medical or surgical hx since PV per pt 

## 2017-02-03 NOTE — Progress Notes (Signed)
To PACU, VSS. Report to [email or web address] 

## 2017-02-03 NOTE — Patient Instructions (Signed)
**   Handout given on diverticulosis **   YOU HAD AN ENDOSCOPIC PROCEDURE TODAY AT THE [facility]:   Refer to the procedure report that was given to you for any specific questions about what was found during the examination.  If the procedure report does not answer your questions, please call your gastroenterologist to clarify.  If you requested that your care partner not be given the details of your procedure findings, then the procedure report has been included in a sealed envelope for you to review at your convenience later.  YOU SHOULD EXPECT: Some feelings of bloating in the abdomen. Passage of more gas than usual.  Walking can help get rid of the air that was put into your GI tract during the procedure and reduce the bloating. If you had a lower endoscopy (such as a colonoscopy or flexible sigmoidoscopy) you may notice spotting of blood in your stool or on the toilet paper. If you underwent a bowel prep for your procedure, you may not have a normal bowel movement for a few days.  Please Note:  You might notice some irritation and congestion in your nose or some drainage.  This is from the oxygen used during your procedure.  There is no need for concern and it should clear up in a day or so.  SYMPTOMS TO REPORT IMMEDIATELY:   Following lower endoscopy (colonoscopy or flexible sigmoidoscopy):  Excessive amounts of blood in the stool  Significant tenderness or worsening of abdominal pains  Swelling of the abdomen that is new, acute  Fever of 100F or higher  For urgent or emergent issues, a gastroenterologist can be reached at any hour by calling [phone].   DIET:  We do recommend a small meal at first, but then you may proceed to your regular diet.  Drink plenty of fluids but you should avoid alcoholic beverages for 24 hours.  ACTIVITY:  You should plan to take it easy for the rest of today and you should NOT DRIVE or use heavy machinery until tomorrow (because of the  sedation medicines used during the test).    FOLLOW UP: Our staff will call the number listed on your records the next business day following your procedure to check on you and address any questions or concerns that you may have regarding the information given to you following your procedure. If we do not reach you, we will leave a message.  However, if you are feeling well and you are not experiencing any problems, there is no need to return our call.  We will assume that you have returned to your regular daily activities without incident.  If any biopsies were taken you will be contacted by phone or by letter within the next 1-3 weeks.  Please call us at [phone] if you have not heard about the biopsies in 3 weeks.    SIGNATURES/CONFIDENTIALITY: You and/or your care partner have signed paperwork which will be entered into your electronic medical record.  These signatures attest to the fact that that the information above on your After Visit Summary has been reviewed and is understood.  Full responsibility of the confidentiality of this discharge information lies with you and/or your care-partner. 

## 2017-02-04 ENCOUNTER — Telehealth: Payer: Self-pay

## 2017-02-04 ENCOUNTER — Telehealth: Payer: Self-pay | Admitting: *Deleted

## 2017-02-04 NOTE — Telephone Encounter (Signed)
Called [phone] and left a messaged we tried to reach pt for a follow up call. maw

## 2017-02-04 NOTE — Telephone Encounter (Signed)
  Follow up Call-  Call back number 02/03/2017  Post procedure Call Back phone  # [phone]  Permission to leave phone message Yes  Some recent data might be hidden     Patient questions:  Do you have a fever, pain , or abdominal swelling? No. Pain Score  0 *  Have you tolerated food without any problems? Yes.    Have you been able to return to your normal activities? Yes.    Do you have any questions about your discharge instructions: Diet   No. Medications  No. Follow up visit  No.  Do you have questions or concerns about your Care? No.  Actions: * If pain score is 4 or above: No action needed, pain <4.

## 2017-03-15 DIAGNOSIS — G8929 Other chronic pain: Secondary | ICD-10-CM | POA: Diagnosis not present

## 2017-03-15 DIAGNOSIS — T8484XA Pain due to internal orthopedic prosthetic devices, implants and grafts, initial encounter: Secondary | ICD-10-CM | POA: Diagnosis not present

## 2017-03-15 DIAGNOSIS — M79671 Pain in right foot: Secondary | ICD-10-CM | POA: Diagnosis not present

## 2017-03-15 DIAGNOSIS — M79672 Pain in left foot: Secondary | ICD-10-CM | POA: Diagnosis not present

## 2017-04-14 NOTE — H&P (Signed)
TOTAL KNEE ADMISSION H&P  Patient is being admitted for right total knee arthroplasty.  Subjective:  Chief Complaint:   Right knee primary OA / pain  HPI: Morgan Gregory, 76 y.o. female, has a history of pain and functional disability in the right knee due to arthritis and has failed non-surgical conservative treatments for greater than 12 weeks to include NSAID's and/or analgesics, corticosteriod injections and activity modification.  Onset of symptoms was abrupt, starting ~1 years ago with gradually worsening course since that time. The patient noted prior procedures on the knee to include  arthroplasty on the left knee(s).  Patient currently rates pain in the right knee(s) at 9 out of 10 with activity. Patient has worsening of pain with activity and weight bearing, pain that interferes with activities of daily living, pain with passive range of motion, crepitus and joint swelling.  Patient has evidence of periarticular osteophytes and joint space narrowing by imaging studies.  There is no active infection.  Risks, benefits and expectations were discussed with the patient.  Risks including but not limited to the risk of anesthesia, blood clots, nerve damage, blood vessel damage, failure of the prosthesis, infection and up to and including death.  Patient understand the risks, benefits and expectations and wishes to proceed with surgery.   PCP: Kelton Pillar, MD  D/C Plans:       Home   Post-op Meds:       No Rx given   Tranexamic Acid:      To be given - IV   Decadron:      Is to be given  FYI:     ASA  Norco  Ambien for sleep (on pre-op)  NOTE:  Right leg constantly swells because of old melanoma excision and damage to area  DME:   Pt already has equipment   PT:   OPPT Rx given to start at [facility] on 04/30/2017   Patient Active Problem List   Diagnosis Date Noted  . 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 S/P knee replacement 03/19/2015   Past Medical History:  Diagnosis Date  . Arthritis    . Cancer ([place]) 1968   melanoma on right shin  . Colon polyps   . Edema    right lower leg/foot due to s/p melanoma excision  . Gastritis   . GERD (gastroesophageal reflux disease)    03-05-15 not an issue now  . 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 Hypothyroidism   . IBS (irritable bowel syndrome)   . Thyroid disease    hypothyroid    Past Surgical History:  Procedure Laterality Date  . ABDOMINAL HYSTERECTOMY  1979  . CATARACT EXTRACTION, BILATERAL     8'15,. 9'15  . CERVICAL SPINE SURGERY  2007   posterior approach-no retained hardware  . COLONOSCOPY    . FOOT SURGERY Bilateral    hammertoe, neuromas, bunionectomy  . MELANOMA EXCISION  1968   right shin/calf- calf measures 0.75 inches larger than left  . THYROIDECTOMY, PARTIAL  2011  . TONSILLECTOMY    . TOTAL KNEE ARTHROPLASTY Left 03/19/2015   Procedure: LEFT TOTAL KNEE ARTHROPLASTY;  Surgeon: Paralee Cancel, MD;  Location: [facility] OR[facility];  Service: Orthopedics;  Laterality: Left;    No current facility-administered medications for this encounter.    Current Outpatient Medications  Medication Sig Dispense Refill Last Dose  . acetaminophen (TYLENOL) 500 MG tablet Take 500 mg by mouth every 6 (six) hours  as needed.   Past Week  . cholecalciferol (VITAMIN D) 1000 UNITS tablet Take 1,000 Units by mouth daily.   Past Week  . ibuprofen (ADVIL,MOTRIN) 200 MG tablet Take 200 mg by mouth every 6 (six) hours as needed.   Unknown  . levothyroxine (SYNTHROID, LEVOTHROID) 50 MCG tablet Take 50 mcg by mouth daily.    02/03/2017  . Naproxen Sodium (ALEVE) 220 MG CAPS Take by mouth. For swelling   Past Week  . Polyethyl Glycol-Propyl Glycol (SYSTANE OP) Apply 1 drop to eye 2 (two) times daily as needed (dry eyes).   more than week  . zolpidem (AMBIEN) 10 MG tablet Take 10 mg by mouth At bedtime as needed for sleep.    02/03/2017   Allergies  Allergen Reactions  . Augmentin [Amoxicillin-Pot  Clavulanate] Diarrhea and Nausea Only    Has patient had a PCN reaction causing immediate rash, facial/tongue/throat swelling, SOB or lightheadedness with hypotension: No Has patient had a PCN reaction causing severe rash involving mucus membranes or skin necrosis: No Has patient had a PCN reaction that required hospitalization No Has patient had a PCN reaction occurring within the last 10 years: No If all of the above answers are "NO", then may proceed with Cephalosporin use.   . Erythromycin Diarrhea and Nausea Only    Severe diarrhea    Social History   Tobacco Use  . Smoking status: Former Smoker    Types: Cigarettes    Last attempt to quit: 03/04/1965    Years since quitting: 52.1  . Smokeless tobacco: Never Used  Substance Use Topics  . Alcohol use: Yes    Alcohol/week: 6.0 oz    Types: 10 Glasses of wine per week    Comment: wine 1 glass daily    Family History  Problem Relation Age of Onset  . Colon cancer Mother 56  . Esophageal cancer Neg Hx   . Rectal cancer Neg Hx   . Stomach cancer Neg Hx      Review of Systems  Constitutional: Negative.   HENT: Negative.   Eyes: Negative.   Respiratory: Negative.   Cardiovascular: Negative.   Gastrointestinal: Negative.   Genitourinary: Positive for frequency and urgency.  Musculoskeletal: Positive for joint pain.  Skin: Negative.   Neurological: Negative.   Endo/Heme/Allergies: Negative.   Psychiatric/Behavioral: Negative.     Objective:  Physical Exam  Constitutional: She is oriented to person, place, and time. She appears well-developed.  HENT:  Head: Normocephalic.  Eyes: Pupils are equal, round, and reactive to light.  Neck: Neck supple. No JVD present. No tracheal deviation present. No thyromegaly present.  Cardiovascular: Normal rate, regular rhythm and intact distal pulses.  Respiratory: Effort normal and breath sounds normal. No respiratory distress. She has no wheezes.  GI: Soft. There is no tenderness.  There is no guarding.  Musculoskeletal:       Right knee: She exhibits decreased range of motion, swelling and bony tenderness. She exhibits no ecchymosis, no deformity, no laceration and no erythema. Tenderness found.  Lymphadenopathy:    She has no cervical adenopathy.  Neurological: She is alert and oriented to person, place, and time. A sensory deficit (numbness in bilateral feet) is present.  Skin: Skin is warm and dry.  Psychiatric: She has a normal mood and affect.     Labs:  Estimated body mass index is 23.91 kg/m as calculated from the following:   Height as of 02/03/17: 5\' 3"  (1.6 m).   Weight as of  02/03/17: 61.2 kg (135 lb).   Imaging Review Plain radiographs demonstrate severe degenerative joint disease of the right knee(s). The bone quality appears to be good for age and reported activity level.  Assessment/Plan:  End stage arthritis, right knee   The patient history, physical examination, clinical judgment of the provider and imaging studies are consistent with end stage degenerative joint disease of the right knee(s) and total knee arthroplasty is deemed medically necessary. The treatment options including medical management, injection therapy arthroscopy and arthroplasty were discussed at length. The risks and benefits of total knee arthroplasty were presented and reviewed. The risks due to aseptic loosening, infection, stiffness, patella tracking problems, thromboembolic complications and other imponderables were discussed. The patient acknowledged the explanation, agreed to proceed with the plan and consent was signed. Patient is being admitted for inpatient treatment for surgery, pain control, PT, OT, prophylactic antibiotics, VTE prophylaxis, progressive ambulation and ADL's and discharge planning. The patient is planning to be discharged home.    West Pugh Aalani Aikens   PA-C  04/14/2017, 11:18 AM

## 2017-04-15 DIAGNOSIS — H524 Presbyopia: Secondary | ICD-10-CM | POA: Diagnosis not present

## 2017-04-21 ENCOUNTER — Encounter (HOSPITAL_COMMUNITY): Payer: Self-pay

## 2017-04-21 NOTE — Patient Instructions (Addendum)
Your procedure is scheduled on: Tuesday, Jan. 8, 2018   Surgery Time:  [id number]   Report to [facility]  Entrance    Report to admitting at 7:30 AM    Call this number if you have problems the morning of surgery [phone]    Do not eat food or drink liquids :After Midnight.   Do NOT smoke after Midnight   Take these medicines the morning of surgery with A SIP OF WATER: Levothyroxine                               You may not have any metal on your body including hair pins, jewelry, and body piercings             Do not wear make-up, lotions, powders, perfumes,or deodorant             Do not wear nail polish.  Do not shave  48 hours prior to surgery.               Do not bring valuables to the hospital. Morgan Gregory.   Contacts, dentures or bridgework may not be worn into surgery.   Leave suitcase in the car. After surgery it may be brought to your room.               Please read over the following fact sheets you were given:  [facility] - Preparing for Surgery Before surgery, you can play an important role.  Because skin is not sterile, your skin needs to be as free of germs as possible.  You can reduce the number of germs on your skin by washing with CHG (chlorahexidine gluconate) soap before surgery.  CHG is an antiseptic cleaner which kills germs and bonds with the skin to continue killing germs even after washing. Please DO NOT use if you have an allergy to CHG or antibacterial soaps.  If your skin becomes reddened/irritated stop using the CHG and inform your nurse when you arrive at Short Stay. Do not shave (including legs and underarms) for at least 48 hours prior to the first CHG shower.  You may shave your face/neck.  Please follow these instructions carefully:  1.  Shower with CHG Soap the night before surgery and the  morning of surgery.  2.  If you choose to wash your hair, wash your hair first  as usual with your normal  shampoo.  3.  After you shampoo, rinse your hair and body thoroughly to remove the shampoo.                             4.  Use CHG as you would any other liquid soap.  You can apply chg directly to the skin and wash.  Gently with a scrungie or clean washcloth.  5.  Apply the CHG Soap to your body ONLY FROM THE NECK DOWN.   Do   not use on face/ open                           Wound or open sores. Avoid contact with eyes, ears mouth and   genitals (private parts).  Wash face,  Genitals (private parts) with your normal soap.             6.  Wash thoroughly, paying special attention to the area where your    surgery  will be performed.  7.  Thoroughly rinse your body with warm water from the neck down.  8.  DO NOT shower/wash with your normal soap after using and rinsing off the CHG Soap.                9.  Pat yourself dry with a clean towel.            10.  Wear clean pajamas.            11.  Place clean sheets on your bed the night of your first shower and do not  sleep with pets. Day of Surgery : Do not apply any lotions/deodorants the morning of surgery.  Please wear clean clothes to the hospital/surgery center.  FAILURE TO FOLLOW THESE INSTRUCTIONS MAY RESULT IN THE CANCELLATION OF YOUR SURGERY  PATIENT SIGNATURE_________________________________  NURSE SIGNATURE__________________________________  ________________________________________________________________________   Morgan Gregory  An incentive spirometer is a tool that can help keep your lungs clear and active. This tool measures how well you are filling your lungs with each breath. Taking long deep breaths may help reverse or decrease the chance of developing breathing (pulmonary) problems (especially infection) following:  A long period of time when you are unable to move or be active. BEFORE THE PROCEDURE   If the spirometer includes an indicator to show your best effort,  your nurse or respiratory therapist will set it to a desired goal.  If possible, sit up straight or lean slightly forward. Try not to slouch.  Hold the incentive spirometer in an upright position. INSTRUCTIONS FOR USE  1. Sit on the edge of your bed if possible, or sit up as far as you can in bed or on a chair. 2. Hold the incentive spirometer in an upright position. 3. Breathe out normally. 4. Place the mouthpiece in your mouth and seal your lips tightly around it. 5. Breathe in slowly and as deeply as possible, raising the piston or the ball toward the top of the column. 6. Hold your breath for 3-5 seconds or for as long as possible. Allow the piston or ball to fall to the bottom of the column. 7. Remove the mouthpiece from your mouth and breathe out normally. 8. Rest for a few seconds and repeat Steps 1 through 7 at least 10 times every 1-2 hours when you are awake. Take your time and take a few normal breaths between deep breaths. 9. The spirometer may include an indicator to show your best effort. Use the indicator as a goal to work toward during each repetition. 10. After each set of 10 deep breaths, practice coughing to be sure your lungs are clear. If you have an incision (the cut made at the time of surgery), support your incision when coughing by placing a pillow or rolled up towels firmly against it. Once you are able to get out of bed, walk around indoors and cough well. You may stop using the incentive spirometer when instructed by your caregiver.  RISKS AND COMPLICATIONS  Take your time so you do not get dizzy or light-headed.  If you are in pain, you may need to take or ask for pain medication before doing incentive spirometry. It is harder to take a deep breath if you  are having pain. AFTER USE  Rest and breathe slowly and easily.  It can be helpful to keep track of a log of your progress. Your caregiver can provide you with a simple table to help with this. If you are  using the spirometer at home, follow these instructions: [place] IF:   You are having difficultly using the spirometer.  You have trouble using the spirometer as often as instructed.  Your pain medication is not giving enough relief while using the spirometer.  You develop fever of 100.5 F (38.1 C) or higher. SEEK IMMEDIATE MEDICAL CARE IF:   You cough up bloody sputum that had not been present before.  You develop fever of 102 F (38.9 C) or greater.  You develop worsening pain at or near the incision site. MAKE SURE YOU:   Understand these instructions.  Will watch your condition.  Will get help right away if you are not doing well or get worse. Document Released: 08/17/2006 Document Revised: 06/29/2011 Document Reviewed: 10/18/2006 Exit[employer] Patient Information 2014 Exit[employer], [place].   ________________________________________________________________________  WHAT IS A BLOOD TRANSFUSION? Blood Transfusion Information  A transfusion is the replacement of blood or some of its parts. Blood is made up of multiple cells which provide different functions.  Red blood cells carry oxygen and are used for blood loss replacement.  White blood cells fight against infection.  Platelets control bleeding.  Plasma helps clot blood.  Other blood products are available for specialized needs, such as hemophilia or other clotting disorders. BEFORE THE TRANSFUSION  Who gives blood for transfusions?   Healthy volunteers who are fully evaluated to make sure their blood is safe. This is blood bank blood. Transfusion therapy is the safest it has ever been in the practice of medicine. Before blood is taken from a donor, a complete history is taken to make sure that person has no history of diseases nor engages in risky social behavior (examples are intravenous drug use or sexual activity with multiple partners). The donor's travel history is screened to minimize risk of transmitting  infections, such as malaria. The donated blood is tested for signs of infectious diseases, such as HIV and hepatitis. The blood is then tested to be sure it is compatible with you in order to minimize the chance of a transfusion reaction. If you or a relative donates blood, this is often done in anticipation of surgery and is not appropriate for emergency situations. It takes many days to process the donated blood. RISKS AND COMPLICATIONS Although transfusion therapy is very safe and saves many lives, the main dangers of transfusion include:   Getting an infectious disease.  Developing a transfusion reaction. This is an allergic reaction to something in the blood you were given. Every precaution is taken to prevent this. The decision to have a blood transfusion has been considered carefully by your caregiver before blood is given. Blood is not given unless the benefits outweigh the risks. AFTER THE TRANSFUSION  Right after receiving a blood transfusion, you will usually feel much better and more energetic. This is especially true if your red blood cells have gotten low (anemic). The transfusion raises the level of the red blood cells which carry oxygen, and this usually causes an energy increase.  The nurse administering the transfusion will monitor you carefully for complications. HOME CARE INSTRUCTIONS  No special instructions are needed after a transfusion. You may find your energy is better. Speak with your caregiver about any limitations on activity  for underlying diseases you may have. SEEK MEDICAL CARE IF:   Your condition is not improving after your transfusion.  You develop redness or irritation at the intravenous (IV) site. SEEK IMMEDIATE MEDICAL CARE IF:  Any of the following symptoms occur over the next 12 hours:  Shaking chills.  You have a temperature by mouth above 102 F (38.9 C), not controlled by medicine.  Chest, back, or muscle pain.  People around you feel you are  not acting correctly or are confused.  Shortness of breath or difficulty breathing.  Dizziness and fainting.  You get a rash or develop hives.  You have a decrease in urine output.  Your urine turns a dark color or changes to pink, red, or brown. Any of the following symptoms occur over the next 10 days:  You have a temperature by mouth above 102 F (38.9 C), not controlled by medicine.  Shortness of breath.  Weakness after normal activity.  The white part of the eye turns yellow (jaundice).  You have a decrease in the amount of urine or are urinating less often.  Your urine turns a dark color or changes to pink, red, or brown. Document Released: 04/03/2000 Document Revised: 06/29/2011 Document Reviewed: 11/21/2007 [facility] Patient Information 2014 [place], [place].  _______________________________________________________________________

## 2017-04-22 ENCOUNTER — Encounter (HOSPITAL_COMMUNITY): Payer: Self-pay

## 2017-04-22 ENCOUNTER — Other Ambulatory Visit: Payer: Self-pay

## 2017-04-22 ENCOUNTER — Encounter (HOSPITAL_COMMUNITY)
Admission: RE | Admit: 2017-04-22 | Discharge: 2017-04-22 | Disposition: A | Discharge status: Home / Self Care | Payer: Medicare Other | Source: Ambulatory Visit | Attending: Orthopedic Surgery | Admitting: Orthopedic Surgery

## 2017-04-22 DIAGNOSIS — M1711 Unilateral primary osteoarthritis, right knee: Secondary | ICD-10-CM | POA: Insufficient documentation

## 2017-04-22 DIAGNOSIS — Z01812 Encounter for preprocedural laboratory examination: Secondary | ICD-10-CM | POA: Insufficient documentation

## 2017-04-22 DIAGNOSIS — Z0183 Encounter for blood typing: Secondary | ICD-10-CM | POA: Diagnosis not present

## 2017-04-22 HISTORY — DX: Personal history of urinary calculi: Z87.442

## 2017-04-22 HISTORY — DX: Spondylosis, unspecified: M47.9

## 2017-04-22 HISTORY — DX: Other cervical disc degeneration, unspecified cervical region: M50.30

## 2017-04-22 HISTORY — DX: Diverticulosis of large intestine without perforation or abscess without bleeding: K57.30

## 2017-04-22 LAB — CBC
HCT: 43 % (ref 36.0–46.0)
HEMOGLOBIN: 14.4 g/dL (ref 12.0–15.0)
MCH: 31 pg (ref 26.0–34.0)
MCHC: 33.5 g/dL (ref 30.0–36.0)
MCV: 92.7 fL (ref 78.0–100.0)
Platelets: 190 10*3/uL (ref 150–400)
RBC: 4.64 MIL/uL (ref 3.87–5.11)
RDW: 12.2 % (ref 11.5–15.5)
WBC: 6.1 10*3/uL (ref 4.0–10.5)

## 2017-04-22 LAB — SURGICAL PCR SCREEN
MRSA, PCR: NEGATIVE
Staphylococcus aureus: NEGATIVE

## 2017-04-27 ENCOUNTER — Encounter (HOSPITAL_COMMUNITY)
Admission: RE | Disposition: A | Discharge status: Home / Self Care | Payer: Self-pay | Source: Ambulatory Visit | Attending: Orthopedic Surgery

## 2017-04-27 ENCOUNTER — Inpatient Hospital Stay (HOSPITAL_COMMUNITY): Payer: Medicare Other | Admitting: Certified Registered Nurse Anesthetist

## 2017-04-27 ENCOUNTER — Inpatient Hospital Stay (HOSPITAL_COMMUNITY)
Admission: RE | Admit: 2017-04-27 | Discharge: 2017-04-28 | DRG: 470 | Disposition: A | Discharge status: Home / Self Care | Payer: Medicare Other | Source: Ambulatory Visit | Attending: Orthopedic Surgery | Admitting: Orthopedic Surgery

## 2017-04-27 ENCOUNTER — Encounter (HOSPITAL_COMMUNITY): Payer: Self-pay | Admitting: *Deleted

## 2017-04-27 ENCOUNTER — Other Ambulatory Visit: Payer: Self-pay

## 2017-04-27 DIAGNOSIS — M25461 Effusion, right knee: Secondary | ICD-10-CM | POA: Diagnosis present

## 2017-04-27 DIAGNOSIS — Z79899 Other long term (current) drug therapy: Secondary | ICD-10-CM

## 2017-04-27 DIAGNOSIS — Z87891 Personal history of nicotine dependence: Secondary | ICD-10-CM

## 2017-04-27 DIAGNOSIS — M5030 Other cervical disc degeneration, unspecified cervical region: Secondary | ICD-10-CM | POA: Diagnosis present

## 2017-04-27 DIAGNOSIS — K219 Gastro-esophageal reflux disease without esophagitis: Secondary | ICD-10-CM | POA: Diagnosis present

## 2017-04-27 DIAGNOSIS — Z87442 Personal history of urinary calculi: Secondary | ICD-10-CM

## 2017-04-27 DIAGNOSIS — M65861 Other synovitis and tenosynovitis, right lower leg: Secondary | ICD-10-CM | POA: Diagnosis present

## 2017-04-27 DIAGNOSIS — M1711 Unilateral primary osteoarthritis, right knee: Secondary | ICD-10-CM | POA: Diagnosis not present

## 2017-04-27 DIAGNOSIS — Z880 Allergy status to penicillin: Secondary | ICD-10-CM

## 2017-04-27 DIAGNOSIS — Z881 Allergy status to other antibiotic agents status: Secondary | ICD-10-CM

## 2017-04-27 DIAGNOSIS — G8918 Other acute postprocedural pain: Secondary | ICD-10-CM | POA: Diagnosis not present

## 2017-04-27 DIAGNOSIS — Z7982 Long term (current) use of aspirin: Secondary | ICD-10-CM

## 2017-04-27 DIAGNOSIS — R110 Nausea: Secondary | ICD-10-CM

## 2017-04-27 DIAGNOSIS — Z86010 Personal history of colonic polyps: Secondary | ICD-10-CM

## 2017-04-27 DIAGNOSIS — K589 Irritable bowel syndrome without diarrhea: Secondary | ICD-10-CM | POA: Diagnosis present

## 2017-04-27 DIAGNOSIS — Z85820 Personal history of malignant melanoma of skin: Secondary | ICD-10-CM

## 2017-04-27 DIAGNOSIS — Z96652 Presence of left artificial knee joint: Secondary | ICD-10-CM | POA: Diagnosis present

## 2017-04-27 DIAGNOSIS — E039 Hypothyroidism, unspecified: Secondary | ICD-10-CM | POA: Diagnosis present

## 2017-04-27 DIAGNOSIS — M25761 Osteophyte, right knee: Secondary | ICD-10-CM | POA: Diagnosis present

## 2017-04-27 DIAGNOSIS — Z96659 Presence of unspecified artificial knee joint: Secondary | ICD-10-CM

## 2017-04-27 HISTORY — PX: TOTAL KNEE ARTHROPLASTY: SHX125

## 2017-04-27 LAB — TYPE AND SCREEN
ABO/RH(D): O POS
Antibody Screen: NEGATIVE

## 2017-04-27 SURGERY — ARTHROPLASTY, KNEE, TOTAL
Anesthesia: Spinal | Site: Knee | Laterality: Right

## 2017-04-27 MED ORDER — PROPOFOL 10 MG/ML IV BOLUS
INTRAVENOUS | Status: DC | PRN
  Administered 2017-04-27: 30 mg via INTRAVENOUS

## 2017-04-27 MED ORDER — SODIUM CHLORIDE 0.9 % IJ SOLN
INTRAMUSCULAR | Status: AC
  Filled 2017-04-27: qty 50

## 2017-04-27 MED ORDER — TRANEXAMIC ACID 1000 MG/10ML IV SOLN
1000.0000 mg | Freq: Once | INTRAVENOUS | Status: AC
  Administered 2017-04-27: 1000 mg via INTRAVENOUS
  Filled 2017-04-27: qty 1100

## 2017-04-27 MED ORDER — ONDANSETRON HCL 4 MG PO TABS: 4.0000 mg | ORAL_TABLET | Freq: Four times a day (QID) | ORAL | Status: DC | PRN

## 2017-04-27 MED ORDER — DEXTROSE 5 % IV SOLN
500.0000 mg | Freq: Four times a day (QID) | INTRAVENOUS | Status: DC | PRN
  Administered 2017-04-27: 500 mg via INTRAVENOUS
  Filled 2017-04-27: qty 550

## 2017-04-27 MED ORDER — OXYCODONE HCL 5 MG PO TABS: 5.0000 mg | ORAL_TABLET | Freq: Once | ORAL | Status: DC | PRN

## 2017-04-27 MED ORDER — METHOCARBAMOL 500 MG PO TABS
500.0000 mg | ORAL_TABLET | Freq: Four times a day (QID) | ORAL | Status: DC | PRN
  Administered 2017-04-27 – 2017-04-28 (×2): 500 mg via ORAL
  Filled 2017-04-27 (×2): qty 1

## 2017-04-27 MED ORDER — PROPOFOL 10 MG/ML IV BOLUS
INTRAVENOUS | Status: AC
  Filled 2017-04-27: qty 20

## 2017-04-27 MED ORDER — ROPIVACAINE HCL 5 MG/ML IJ SOLN
INTRAMUSCULAR | Status: DC | PRN
  Administered 2017-04-27: 20 mL via PERINEURAL

## 2017-04-27 MED ORDER — DEXAMETHASONE SODIUM PHOSPHATE 10 MG/ML IJ SOLN
INTRAMUSCULAR | Status: AC
Start: 2017-04-27 — End: 2017-04-27
  Filled 2017-04-27: qty 1

## 2017-04-27 MED ORDER — DEXAMETHASONE SODIUM PHOSPHATE 10 MG/ML IJ SOLN
10.0000 mg | Freq: Once | INTRAMUSCULAR | Status: AC
  Administered 2017-04-27: 10 mg via INTRAVENOUS

## 2017-04-27 MED ORDER — ACETAMINOPHEN 650 MG RE SUPP: 650.0000 mg | RECTAL | Status: DC | PRN

## 2017-04-27 MED ORDER — TRANEXAMIC ACID 1000 MG/10ML IV SOLN
1000.0000 mg | INTRAVENOUS | Status: AC
  Administered 2017-04-27: 1000 mg via INTRAVENOUS
  Filled 2017-04-27: qty 1100

## 2017-04-27 MED ORDER — OXYCODONE HCL 5 MG/5ML PO SOLN
5.0000 mg | Freq: Once | ORAL | Status: DC | PRN
  Filled 2017-04-27: qty 5

## 2017-04-27 MED ORDER — HYDROMORPHONE HCL 1 MG/ML IJ SOLN
0.2500 mg | INTRAMUSCULAR | Status: DC | PRN
  Administered 2017-04-27 (×3): 0.5 mg via INTRAVENOUS

## 2017-04-27 MED ORDER — SODIUM CHLORIDE 0.9 % IV SOLN
INTRAVENOUS | Status: DC
  Administered 2017-04-27 (×2): via INTRAVENOUS

## 2017-04-27 MED ORDER — MAGNESIUM CITRATE PO SOLN: 1.0000 | Freq: Once | ORAL | Status: DC | PRN

## 2017-04-27 MED ORDER — CHLORHEXIDINE GLUCONATE 4 % EX LIQD: 60.0000 mL | Freq: Once | CUTANEOUS | Status: DC

## 2017-04-27 MED ORDER — ASPIRIN 81 MG PO CHEW
81.0000 mg | CHEWABLE_TABLET | Freq: Two times a day (BID) | ORAL | Status: DC
  Administered 2017-04-27 – 2017-04-28 (×2): 81 mg via ORAL
  Filled 2017-04-27 (×2): qty 1

## 2017-04-27 MED ORDER — METHOCARBAMOL 500 MG PO TABS: 500.0000 mg | ORAL_TABLET | Freq: Four times a day (QID) | ORAL | 0 refills | Status: DC | PRN

## 2017-04-27 MED ORDER — HYDROMORPHONE HCL 1 MG/ML IJ SOLN
0.5000 mg | INTRAMUSCULAR | Status: DC | PRN
  Administered 2017-04-27: 0.5 mg via INTRAVENOUS
  Administered 2017-04-27: 1 mg via INTRAVENOUS
  Filled 2017-04-27 (×2): qty 1

## 2017-04-27 MED ORDER — DOCUSATE SODIUM 100 MG PO CAPS
100.0000 mg | ORAL_CAPSULE | Freq: Two times a day (BID) | ORAL | Status: DC
  Administered 2017-04-27 – 2017-04-28 (×2): 100 mg via ORAL
  Filled 2017-04-27 (×2): qty 1

## 2017-04-27 MED ORDER — HYDROMORPHONE HCL 1 MG/ML IJ SOLN
INTRAMUSCULAR | Status: AC
  Filled 2017-04-27: qty 1

## 2017-04-27 MED ORDER — KETOROLAC TROMETHAMINE 30 MG/ML IJ SOLN
INTRAMUSCULAR | Status: DC | PRN
  Administered 2017-04-27: 30 mg

## 2017-04-27 MED ORDER — ACETAMINOPHEN 325 MG PO TABS: 650.0000 mg | ORAL_TABLET | ORAL | Status: DC | PRN

## 2017-04-27 MED ORDER — POLYETHYLENE GLYCOL 3350 17 G PO PACK
17.0000 g | PACK | Freq: Two times a day (BID) | ORAL | Status: DC
  Administered 2017-04-28: 17 g via ORAL
  Filled 2017-04-27: qty 1

## 2017-04-27 MED ORDER — ALUM & MAG HYDROXIDE-SIMETH 200-200-20 MG/5ML PO SUSP: 15.0000 mL | ORAL | Status: DC | PRN

## 2017-04-27 MED ORDER — FERROUS SULFATE 325 (65 FE) MG PO TABS: 325.0000 mg | ORAL_TABLET | Freq: Three times a day (TID) | ORAL | 3 refills | Status: DC

## 2017-04-27 MED ORDER — PHENOL 1.4 % MT LIQD
1.0000 | OROMUCOSAL | Status: DC | PRN
  Filled 2017-04-27: qty 177

## 2017-04-27 MED ORDER — METOCLOPRAMIDE HCL 5 MG PO TABS: 5.0000 mg | ORAL_TABLET | Freq: Three times a day (TID) | ORAL | Status: DC | PRN

## 2017-04-27 MED ORDER — ASPIRIN 81 MG PO CHEW: 81.0000 mg | CHEWABLE_TABLET | Freq: Two times a day (BID) | ORAL | 0 refills | Status: AC

## 2017-04-27 MED ORDER — ONDANSETRON HCL 4 MG/2ML IJ SOLN
INTRAMUSCULAR | Status: DC | PRN
  Administered 2017-04-27: 4 mg via INTRAVENOUS

## 2017-04-27 MED ORDER — PROMETHAZINE HCL 25 MG/ML IJ SOLN: 6.2500 mg | INTRAMUSCULAR | Status: DC | PRN

## 2017-04-27 MED ORDER — POLYETHYLENE GLYCOL 3350 17 G PO PACK: 17.0000 g | PACK | Freq: Two times a day (BID) | ORAL | 0 refills | Status: DC

## 2017-04-27 MED ORDER — HYDROCODONE-ACETAMINOPHEN 7.5-325 MG PO TABS: 1.0000 | ORAL_TABLET | ORAL | 0 refills | Status: DC | PRN

## 2017-04-27 MED ORDER — HYDROCODONE-ACETAMINOPHEN 7.5-325 MG PO TABS
1.0000 | ORAL_TABLET | ORAL | Status: DC | PRN
  Administered 2017-04-27 – 2017-04-28 (×3): 1 via ORAL

## 2017-04-27 MED ORDER — BUPIVACAINE-EPINEPHRINE (PF) 0.25% -1:200000 IJ SOLN
INTRAMUSCULAR | Status: DC | PRN
  Administered 2017-04-27: 30 mL

## 2017-04-27 MED ORDER — HYDROCODONE-ACETAMINOPHEN 7.5-325 MG PO TABS
2.0000 | ORAL_TABLET | ORAL | Status: DC | PRN
  Administered 2017-04-27 – 2017-04-28 (×2): 2 via ORAL
  Filled 2017-04-27 (×3): qty 2

## 2017-04-27 MED ORDER — LEVOTHYROXINE SODIUM 50 MCG PO TABS
50.0000 ug | ORAL_TABLET | Freq: Every day | ORAL | Status: DC
  Administered 2017-04-28: 50 ug via ORAL
  Filled 2017-04-27: qty 1

## 2017-04-27 MED ORDER — CEFAZOLIN SODIUM-DEXTROSE 2-4 GM/100ML-% IV SOLN
2.0000 g | Freq: Four times a day (QID) | INTRAVENOUS | Status: AC
  Administered 2017-04-27 (×2): 2 g via INTRAVENOUS
  Filled 2017-04-27 (×2): qty 100

## 2017-04-27 MED ORDER — MENTHOL 3 MG MT LOZG: 1.0000 | LOZENGE | OROMUCOSAL | Status: DC | PRN

## 2017-04-27 MED ORDER — BUPIVACAINE-EPINEPHRINE 0.25% -1:200000 IJ SOLN
INTRAMUSCULAR | Status: AC
  Filled 2017-04-27: qty 1

## 2017-04-27 MED ORDER — BISACODYL 10 MG RE SUPP: 10.0000 mg | Freq: Every day | RECTAL | Status: DC | PRN

## 2017-04-27 MED ORDER — ONDANSETRON HCL 4 MG/2ML IJ SOLN
4.0000 mg | Freq: Four times a day (QID) | INTRAMUSCULAR | Status: DC | PRN
  Administered 2017-04-27: 4 mg via INTRAVENOUS
  Filled 2017-04-27: qty 2

## 2017-04-27 MED ORDER — MIDAZOLAM HCL 2 MG/2ML IJ SOLN
1.0000 mg | INTRAMUSCULAR | Status: AC
  Administered 2017-04-27: 1 mg via INTRAVENOUS
  Filled 2017-04-27: qty 2

## 2017-04-27 MED ORDER — DOCUSATE SODIUM 100 MG PO CAPS: 100.0000 mg | ORAL_CAPSULE | Freq: Two times a day (BID) | ORAL | 0 refills | Status: DC

## 2017-04-27 MED ORDER — DIPHENHYDRAMINE HCL 12.5 MG/5ML PO ELIX: 12.5000 mg | ORAL_SOLUTION | ORAL | Status: DC | PRN

## 2017-04-27 MED ORDER — KETOROLAC TROMETHAMINE 30 MG/ML IJ SOLN
INTRAMUSCULAR | Status: AC
  Filled 2017-04-27: qty 1

## 2017-04-27 MED ORDER — ZOLPIDEM TARTRATE 5 MG PO TABS
5.0000 mg | ORAL_TABLET | Freq: Every day | ORAL | Status: DC
  Administered 2017-04-27: 5 mg via ORAL
  Filled 2017-04-27: qty 1

## 2017-04-27 MED ORDER — CELECOXIB 200 MG PO CAPS
200.0000 mg | ORAL_CAPSULE | Freq: Two times a day (BID) | ORAL | Status: DC
  Administered 2017-04-27 – 2017-04-28 (×2): 200 mg via ORAL
  Filled 2017-04-27 (×2): qty 1

## 2017-04-27 MED ORDER — METOCLOPRAMIDE HCL 5 MG/ML IJ SOLN: 5.0000 mg | Freq: Three times a day (TID) | INTRAMUSCULAR | Status: DC | PRN

## 2017-04-27 MED ORDER — DEXAMETHASONE SODIUM PHOSPHATE 10 MG/ML IJ SOLN
10.0000 mg | Freq: Once | INTRAMUSCULAR | Status: AC
  Administered 2017-04-28: 10 mg via INTRAVENOUS
  Filled 2017-04-27: qty 1

## 2017-04-27 MED ORDER — FERROUS SULFATE 325 (65 FE) MG PO TABS
325.0000 mg | ORAL_TABLET | Freq: Three times a day (TID) | ORAL | Status: DC
  Administered 2017-04-28 (×2): 325 mg via ORAL
  Filled 2017-04-27 (×2): qty 1

## 2017-04-27 MED ORDER — CEFAZOLIN SODIUM-DEXTROSE 2-4 GM/100ML-% IV SOLN
2.0000 g | INTRAVENOUS | Status: AC
  Administered 2017-04-27: 2 g via INTRAVENOUS
  Filled 2017-04-27: qty 100

## 2017-04-27 MED ORDER — BUPIVACAINE IN DEXTROSE 0.75-8.25 % IT SOLN
INTRATHECAL | Status: DC | PRN
  Administered 2017-04-27: 2 mL via INTRATHECAL

## 2017-04-27 MED ORDER — ALBUMIN HUMAN 5 % IV SOLN
INTRAVENOUS | Status: AC
  Filled 2017-04-27: qty 250

## 2017-04-27 MED ORDER — LACTATED RINGERS IV SOLN
INTRAVENOUS | Status: DC
  Administered 2017-04-27 (×2): via INTRAVENOUS
  Administered 2017-04-27: 1000 mL via INTRAVENOUS

## 2017-04-27 MED ORDER — PROPOFOL 500 MG/50ML IV EMUL
INTRAVENOUS | Status: DC | PRN
  Administered 2017-04-27: 100 ug/kg/min via INTRAVENOUS

## 2017-04-27 MED ORDER — FENTANYL CITRATE (PF) 100 MCG/2ML IJ SOLN
50.0000 ug | INTRAMUSCULAR | Status: AC
  Administered 2017-04-27: 50 ug via INTRAVENOUS
  Filled 2017-04-27: qty 2

## 2017-04-27 MED ORDER — SODIUM CHLORIDE 0.9 % IJ SOLN
INTRAMUSCULAR | Status: DC | PRN
  Administered 2017-04-27: 29 mL

## 2017-04-27 SURGICAL SUPPLY — 50 items
ADH SKN CLS APL DERMABOND .7 (GAUZE/BANDAGES/DRESSINGS) ×1
BAG DECANTER FOR FLEXI CONT (MISCELLANEOUS) IMPLANT
BAG SPEC THK2 15X12 ZIP CLS (MISCELLANEOUS)
BAG ZIPLOCK 12X15 (MISCELLANEOUS) IMPLANT
BANDAGE ACE 6X5 VEL STRL LF (GAUZE/BANDAGES/DRESSINGS) ×2 IMPLANT
BLADE SAW SGTL 11.0X1.19X90.0M (BLADE) ×1 IMPLANT
BLADE SAW SGTL 13.0X1.19X90.0M (BLADE) ×2 IMPLANT
BOWL SMART MIX CTS (DISPOSABLE) ×2 IMPLANT
CAPT KNEE TOTAL 3 ATTUNE ×1 IMPLANT
CEMENT HV SMART SET (Cement) ×2 IMPLANT
COVER SURGICAL LIGHT HANDLE (MISCELLANEOUS) ×2 IMPLANT
CUFF TOURN SGL QUICK 34 (TOURNIQUET CUFF) ×2
CUFF TRNQT CYL 34X4X40X1 (TOURNIQUET CUFF) ×1 IMPLANT
DECANTER SPIKE VIAL GLASS SM (MISCELLANEOUS) ×3 IMPLANT
DERMABOND ADVANCED (GAUZE/BANDAGES/DRESSINGS) ×1
DERMABOND ADVANCED .7 DNX12 (GAUZE/BANDAGES/DRESSINGS) ×1 IMPLANT
DRAPE U-SHAPE 47X51 STRL (DRAPES) ×2 IMPLANT
DRESSING AQUACEL AG SP 3.5X10 (GAUZE/BANDAGES/DRESSINGS) ×1 IMPLANT
DRSG AQUACEL AG SP 3.5X10 (GAUZE/BANDAGES/DRESSINGS) ×2
DURAPREP 26ML APPLICATOR (WOUND CARE) ×4 IMPLANT
ELECT REM PT RETURN 15FT ADLT (MISCELLANEOUS) ×2 IMPLANT
GLOVE BIOGEL M 7.0 STRL (GLOVE) IMPLANT
GLOVE BIOGEL PI IND STRL 7.5 (GLOVE) ×1 IMPLANT
GLOVE BIOGEL PI IND STRL 8.5 (GLOVE) ×1 IMPLANT
GLOVE BIOGEL PI INDICATOR 7.5 (GLOVE) ×5
GLOVE BIOGEL PI INDICATOR 8.5 (GLOVE) ×1
GLOVE ECLIPSE 8.0 STRL XLNG CF (GLOVE) ×3 IMPLANT
GLOVE ORTHO TXT STRL SZ7.5 (GLOVE) ×3 IMPLANT
GOWN STRL REUS W/TWL LRG LVL3 (GOWN DISPOSABLE) ×2 IMPLANT
GOWN STRL REUS W/TWL XL LVL3 (GOWN DISPOSABLE) ×2 IMPLANT
HANDPIECE INTERPULSE COAX TIP (DISPOSABLE) ×2
MANIFOLD NEPTUNE II (INSTRUMENTS) ×2 IMPLANT
PACK TOTAL KNEE CUSTOM (KITS) ×2 IMPLANT
POSITIONER SURGICAL ARM (MISCELLANEOUS) ×2 IMPLANT
SET HNDPC FAN SPRY TIP SCT (DISPOSABLE) ×1 IMPLANT
SET PAD KNEE POSITIONER (MISCELLANEOUS) ×2 IMPLANT
SUT MNCRL AB 4-0 PS2 18 (SUTURE) ×2 IMPLANT
SUT STRATAFIX 0 PDS 27 VIOLET (SUTURE)
SUT STRATAFIX SPIRAL PDS+ 70CM (SUTURE) ×2
SUT VIC AB 1 CT1 36 (SUTURE) ×2 IMPLANT
SUT VIC AB 2-0 CT1 27 (SUTURE) ×6
SUT VIC AB 2-0 CT1 TAPERPNT 27 (SUTURE) ×3 IMPLANT
SUTURE STRATFX 0 PDS 27 VIOLET (SUTURE) ×1 IMPLANT
SUTURE STRATFX SPIRL PDS+ 70CM (SUTURE) IMPLANT
SYR 50ML LL SCALE MARK (SYRINGE) ×1 IMPLANT
TRAY FOLEY CATH 14FRSI W/METER (CATHETERS) ×1 IMPLANT
TRAY FOLEY W/METER SILVER 16FR (SET/KITS/TRAYS/PACK) ×1 IMPLANT
WATER STERILE IRR 1000ML POUR (IV SOLUTION) ×3 IMPLANT
WRAP KNEE MAXI GEL POST OP (GAUZE/BANDAGES/DRESSINGS) ×2 IMPLANT
YANKAUER SUCT BULB TIP 10FT TU (MISCELLANEOUS) ×2 IMPLANT

## 2017-04-27 NOTE — Transfer of Care (Signed)
Immediate Anesthesia Transfer of Care Note  Patient: Morgan Gregory  Procedure(s) Performed: RIGHT TOTAL KNEE ARTHROPLASTY (Right Knee)  Patient Location: PACU  Anesthesia Type:Spinal and MAC combined with regional for post-op pain  Level of Consciousness: awake, alert  and oriented  Airway & Oxygen Therapy: Patient Spontanous Breathing and Patient connected to face mask oxygen  Post-op Assessment: Report given to RN and Post -op Vital signs reviewed and stable  Post vital signs: Reviewed and stable  Last Vitals:  Vitals:   04/27/17 1023 04/27/17 1024  BP:    Pulse: 64 62  Resp: 15 10  Temp:    SpO2: 99% 100%    Last Pain:  Vitals:   04/27/17 0756  TempSrc: Oral         Complications: No apparent anesthesia complications

## 2017-04-27 NOTE — Anesthesia Procedure Notes (Signed)
Spinal

## 2017-04-27 NOTE — Anesthesia Procedure Notes (Signed)
Spinal  Start time: 04/27/2017 10:39 AM End time: 04/27/2017 10:45 AM Staffing Anesthesiologist: Lynda Rainwater, MD Resident/CRNA: Wanita Chamberlain, CRNA Spinal Block Patient position: sitting Prep: ChloraPrep Patient monitoring: heart rate, cardiac monitor, continuous pulse ox and blood pressure Approach: midline Location: L3-4 Injection technique: single-shot Needle Needle type: [place]  Needle gauge: 22 G Needle length: 5 cm Assessment Sensory level: T6 Additional Notes Clear CSF, neg paresthesia, adequate block, Pt. Tolerated well R. Sauve CRNA

## 2017-04-27 NOTE — Anesthesia Procedure Notes (Signed)
Anesthesia Regional Block: Adductor canal block   Pre-Anesthetic Checklist: ,, timeout performed, Correct Patient, Correct Site, Correct Laterality, Correct Procedure, Correct Position, site marked, Risks and benefits discussed,  Surgical consent,  Pre-op evaluation,  At surgeon's request and post-op pain management  Laterality: Right  Prep: chloraprep       Needles:  Injection technique: Single-shot  Needle Type: Stimiplex     Needle Length: 9cm  Needle Gauge: 21     Additional Needles:   Procedures:,,,, ultrasound used (permanent image in chart),,,,  Narrative:  Start time: 04/27/2017 10:03 AM End time: 04/27/2017 10:08 AM Injection made incrementally with aspirations every 5 mL.  Performed by: Personally  Anesthesiologist: Lynda Rainwater, MD

## 2017-04-27 NOTE — Anesthesia Postprocedure Evaluation (Signed)
Anesthesia Post Note  Patient: Morgan Gregory  Procedure(s) Performed: RIGHT TOTAL KNEE ARTHROPLASTY (Right Knee)     Patient location during evaluation: PACU Anesthesia Type: Spinal Level of consciousness: oriented and awake and alert Pain management: pain level controlled Vital Signs Assessment: post-procedure vital signs reviewed and stable Respiratory status: spontaneous breathing and respiratory function stable Cardiovascular status: blood pressure returned to baseline and stable Postop Assessment: no headache, no backache and no apparent nausea or vomiting Anesthetic complications: no    Last Vitals:  Vitals:   04/27/17 1024 04/27/17 1219  BP:  129/61  Pulse: 62 62  Resp: 10 17  Temp:  36.4 C  SpO2: 100% 100%    Last Pain:  Vitals:   04/27/17 1245  TempSrc:   PainSc: (P) 7                  Lynda Rainwater

## 2017-04-27 NOTE — Discharge Instructions (Signed)

## 2017-04-27 NOTE — Progress Notes (Signed)
AssistedDr. Miller with right, ultrasound guided, adductor canal block. Side rails up, monitors on throughout procedure. See vital signs in flow sheet. Tolerated Procedure well.  

## 2017-04-27 NOTE — Evaluation (Signed)
Physical Therapy Evaluation Patient Details Name: Morgan Gregory MRN: [id number] DOB: [birth year] Today's Date: 04/27/2017   History of Present Illness  77 yo female s/p R TKA 04/27/17. Hx of L TKA 2016  Clinical Impression  On eval POD 0, pt required Mod assist for mobility. She was able to take a few lateral steps along the side of the bed with a walker. Increased time to complete all tasks. Pain rated 9/10. Will follow and progress activity as tolerated. Per chart, MD has arranged for OP PT.     Follow Up Recommendations DC plan and follow up therapy as arranged by surgeon    Equipment Recommendations  None recommended by PT    Recommendations for Other Services       Precautions / Restrictions Precautions Precautions: Fall;Knee Restrictions Weight Bearing Restrictions: No RLE Weight Bearing: Weight bearing as tolerated      Mobility  Bed Mobility Overal bed mobility: Needs Assistance Bed Mobility: Supine to Sit;Sit to Supine     Supine to sit: Min assist Sit to supine: Min assist   General bed mobility comments: Assist for R LE. Increased time. Mod VCs.   Transfers Overall transfer level: Needs assistance Equipment used: Rolling walker (2 wheeled) Transfers: Sit to/from Stand Sit to Stand: Mod assist;From elevated surface         General transfer comment: Assist to rise, stabilize, control descent. VCs safety, technique, hand/LE placement.   Ambulation/Gait Ambulation/Gait assistance: Mod assist   Assistive device: Rolling walker (2 wheeled) Gait Pattern/deviations: Step-to pattern;Antalgic;Decreased stance time - right;Trunk flexed     General Gait Details: Assist to stabilize pt and maneuver with RW. VCs safety, technique, sequence.  Stairs            Wheelchair Mobility    Modified Rankin (Stroke Patients Only)       Balance Overall balance assessment: Needs assistance         Standing balance support: Bilateral upper extremity  supported Standing balance-Leahy Scale: Poor                               Pertinent Vitals/Pain Pain Assessment: 0-10 Pain Score: 9  Pain Location: R knee Pain Descriptors / Indicators: Heaviness;Sore;Aching Pain Intervention(s): Limited activity within patient's tolerance;Repositioned    Home Living Family/patient expects to be discharged to:: Private residence Living Arrangements: Alone Available Help at Discharge: Family(sister) Type of Home: House Home Access: Stairs to enter Entrance Stairs-Rails: Psychiatric nurse of Steps: 2 Home Layout: Able to live on main level with bedroom/bathroom Home Equipment: Walker - 2 wheels;Crutches;Bedside commode      Prior Function Level of Independence: Independent               Hand Dominance        Extremity/Trunk Assessment   Upper Extremity Assessment Upper Extremity Assessment: Defer to OT evaluation    Lower Extremity Assessment Lower Extremity Assessment: Generalized weakness(s/p R TKA)    Cervical / Trunk Assessment Cervical / Trunk Assessment: Normal  Communication   Communication: No difficulties  Cognition Arousal/Alertness: Awake/alert Behavior During Therapy: WFL for tasks assessed/performed Overall Cognitive Status: Within Functional Limits for tasks assessed                                        General Comments      Exercises  Assessment/Plan    PT Assessment Patient needs continued PT services  PT Problem List Decreased strength;Decreased balance;Decreased range of motion;Decreased mobility;Pain;Decreased knowledge of use of DME       PT Treatment Interventions DME instruction;Functional mobility training;Gait training;Therapeutic activities;Patient/family education;Therapeutic exercise;Stair training;Balance training    PT Goals (Current goals can be found in the Care Plan section)  Acute Rehab PT Goals Patient Stated Goal: home. regain  independence PT Goal Formulation: With patient Time For Goal Achievement: 05/11/17 Potential to Achieve Goals: Good    Frequency 7X/week   Barriers to discharge        Co-evaluation               AM-PAC PT "6 Clicks" Daily Activity  Outcome Measure Difficulty turning over in bed (including adjusting bedclothes, sheets and blankets)?: Unable Difficulty moving from lying on back to sitting on the side of the bed? : Unable Difficulty sitting down on and standing up from a chair with arms (e.g., wheelchair, bedside commode, etc,.)?: Unable Help needed moving to and from a bed to chair (including a wheelchair)?: A Lot Help needed walking in hospital room?: A Lot Help needed climbing 3-5 steps with a railing? : A Lot 6 Click Score: 9    End of Session Equipment Utilized During Treatment: Gait belt Activity Tolerance: Patient limited by pain Patient left: in bed;with call bell/phone within reach;with bed alarm set   PT Visit Diagnosis: Muscle weakness (generalized) (M62.81);Difficulty in walking, not elsewhere classified (R26.2)    Time: 1711-1740 PT Time Calculation (min) (ACUTE ONLY): 29 min   Charges:   PT Evaluation $PT Eval Low Complexity: 1 Low PT Treatments $Therapeutic Activity: 8-22 mins   PT G Codes:          Weston Anna, MPT Pager: [phone]

## 2017-04-27 NOTE — Interval H&P Note (Signed)
History and Physical Interval Note:  04/27/2017 9:20 AM  Morgan Gregory  has presented today for surgery, with the diagnosis of Right knee osteoarthritis  The various methods of treatment have been discussed with the patient and family. After consideration of risks, benefits and other options for treatment, the patient has consented to  Procedure(s) with comments: RIGHT TOTAL KNEE ARTHROPLASTY (Right) - 70 mins as a surgical intervention .  The patient's history has been reviewed, patient examined, no change in status, stable for surgery.  I have reviewed the patient's chart and labs.  Questions were answered to the patient's satisfaction.     [name]

## 2017-04-27 NOTE — Anesthesia Preprocedure Evaluation (Signed)
Anesthesia Evaluation  Patient identified by MRN, date of birth, ID band Patient awake    Reviewed: Allergy & Precautions, H&P , NPO status , Patient's Chart, lab work & pertinent test results  Airway Mallampati: II  TM Distance: >3 FB Neck ROM: full    Dental no notable dental hx. (+) Dental Advisory Given, Teeth Intact   Pulmonary neg pulmonary ROS, former smoker,    Pulmonary exam normal breath sounds clear to auscultation       Cardiovascular Exercise Tolerance: Good negative cardio ROS Normal cardiovascular exam Rhythm:regular Rate:Normal     Neuro/Psych Neuropathy in feet. Cervical spine surgery  Neuromuscular disease negative neurological ROS  negative psych ROS   GI/Hepatic negative GI ROS, Neg liver ROS, hiatal hernia, GERD  Medicated and Controlled,  Endo/Other  negative endocrine ROSHypothyroidism   Renal/GU negative Renal ROS  negative genitourinary   Musculoskeletal  (+) Arthritis , Osteoarthritis,    Abdominal   Peds  Hematology negative hematology ROS (+)   Anesthesia Other Findings   Reproductive/Obstetrics negative OB ROS                             Anesthesia Physical  Anesthesia Plan  ASA: II  Anesthesia Plan: Spinal   Post-op Pain Management:  Regional for Post-op pain   Induction: Intravenous  PONV Risk Score and Plan: 2 and Ondansetron and Dexamethasone  Airway Management Planned: Simple Face Mask  Additional Equipment:   Intra-op Plan:   Post-operative Plan:   Informed Consent: I have reviewed the patients History and Physical, chart, labs and discussed the procedure including the risks, benefits and alternatives for the proposed anesthesia with the patient or authorized representative who has indicated his/her understanding and acceptance.   Dental Advisory Given  Plan Discussed with: CRNA and Surgeon  Anesthesia Plan Comments:          Anesthesia Quick Evaluation

## 2017-04-27 NOTE — Plan of Care (Signed)
Plan of care reviewed and discussed with patient.   

## 2017-04-27 NOTE — Op Note (Signed)
NAME:  Morgan Gregory RECORD NO.:  [id number]                             FACILITY:  [facility]      PHYSICIAN:  Pietro Cassis. Alvan Dame, M.D.  DATE OF BIRTH:  March 05, 1941      DATE OF PROCEDURE:  04/27/2017                                     OPERATIVE REPORT         PREOPERATIVE DIAGNOSIS:  Right knee osteoarthritis.      POSTOPERATIVE DIAGNOSIS:  Right knee osteoarthritis.      FINDINGS:  The patient was noted to have complete loss of cartilage and   bone-on-bone arthritis with associated osteophytes in the patellofemoral and lateral compartments of   the knee with a significant synovitis and associated effusion.      PROCEDURE:  Right total knee replacement.      COMPONENTS USED:  DePuy Attune rotating platform posterior stabilized knee   system, a size 4 femur, 4 tibia, size mm PS AOX insert, and 35 anatomic patellar   button.      SURGEON:  Pietro Cassis. Alvan Dame, M.D.      ASSISTANT:  Danae Orleans, PA-C.      ANESTHESIA:  Regional and Spinal.      SPECIMENS:  None.      COMPLICATION:  None.      DRAINS:  None.  EBL: <50cc      TOURNIQUET TIME:   Total Tourniquet Time Documented: Thigh (Right) - 24 minutes Total: Thigh (Right) - 24 minutes     The patient was stable to the recovery room.      INDICATION FOR PROCEDURE:  Morgan Gregory is a 77 y.o. female patient of   mine.  The patient had been seen, evaluated, and treated conservatively in the   office with medication, activity modification, and injections.  The patient had   radiographic changes of bone-on-bone arthritis with endplate sclerosis and osteophytes noted.      The patient failed conservative measures including medication, injections, and activity modification, and at this point was ready for more definitive measures.   Based on the radiographic changes and failed conservative measures, the patient   decided to proceed with total knee replacement.  Risks of infection,   DVT,  component failure, need for revision surgery, postop course, and   expectations were all   discussed and reviewed.  Consent was obtained for benefit of pain   relief.      PROCEDURE IN DETAIL:  The patient was brought to the operative theater.   Once adequate anesthesia, preoperative antibiotics, 2 gm of Ancef, 1 gm of Tranexamic Acid, and 10 mg of Decadron administered, the patient was positioned supine with the right thigh tourniquet placed.  The  right lower extremity was prepped and draped in sterile fashion.  A time-   out was performed identifying the patient, planned procedure, and   extremity.      The right lower extremity was placed in the [facility] leg holder.  The leg was   exsanguinated, tourniquet elevated to 250 mmHg.  A midline incision was   made followed by  median parapatellar arthrotomy.  Following initial   exposure, attention was first directed to the patella.  Precut   measurement was noted to be 20 mm.  I resected down to 13 mm and used a   35 anatomic patellar button to restore patellar height as well as cover the cut   surface.      The lug holes were drilled and a metal shim was placed to protect the   patella from retractors and saw blades.      At this point, attention was now directed to the femur.  The femoral   canal was opened with a drill, irrigated to try to prevent fat emboli.  An   intramedullary rod was passed at 3 degrees valgus, 9 mm of bone was   resected off the distal femur.  Following this resection, the tibia was   subluxated anteriorly.  Using the extramedullary guide, 2 mm of bone was resected off   the proximal medial tibia.  We confirmed the gap would be   stable medially and laterally with a size 6 spacer block as well as confirmed   the cut was perpendicular in the coronal plane, checking with an alignment rod.      Once this was done, I sized the femur to be a size 4 in the anterior-   posterior dimension, chose a narrow component based  on medial and   lateral dimension.  The size 4 rotation block was then pinned in   position anterior referenced using the C-clamp to set rotation.  The   anterior, posterior, and  chamfer cuts were made without difficulty nor   notching making certain that I was along the anterior cortex to help   with flexion gap stability.      The final box cut was made off the lateral aspect of distal femur.      At this point, the tibia was sized to be a size 4, the size 4 tray was   then pinned in position through the medial third of the tubercle,   drilled, and keel punched.  Trial reduction was now carried with a 4 femur,  4 tibia, a size 6 then 7 mm PS insert, and the 35 anatomic patella botton.  The knee was brought to   extension, full extension with good flexion stability with the patella   tracking through the trochlea without application of pressure.  Given   all these findings the femoral lug holes were drilled and then the trial components removed.  Final components were   opened and cement was mixed.  The knee was irrigated with normal saline   solution and pulse lavage.  The synovial lining was   then injected with 30 cc of 0.25% Marcaine with epinephrine and 1 cc of Toradol plus 30 cc of NS for a total of 61 cc.      The knee was irrigated.  Final implants were then cemented onto clean and   dried cut surfaces of bone with the knee brought to extension with a size 7 mm PS trial insert.      Once the cement had fully cured, the excess cement was removed   throughout the knee.  I confirmed I was satisfied with the range of   motion and stability, and the final size 7 mm  PS AOX insert was chosen.  It was   placed into the knee.      The tourniquet had been let down at  24 minutes.  No significant   hemostasis required.  The   extensor mechanism was then reapproximated using #1 Vicryl and #1 Stratafix sutures with the knee   in flexion.  The   remaining wound was closed with 2-0 Vicryl  and running 4-0 Monocryl.   The knee was cleaned, dried, dressed sterilely using Dermabond and   Aquacel dressing.  The patient was then   brought to recovery room in stable condition, tolerating the procedure   well.   Please note that Physician Assistant, Danae Orleans, PA-C, was present for the entirety of the case, and was utilized for pre-operative positioning, peri-operative retractor management, general facilitation of the procedure.  He was also utilized for primary wound closure at the end of the case.              Pietro Cassis Alvan Dame, M.D.    04/27/2017 11:54 AM

## 2017-04-28 LAB — BASIC METABOLIC PANEL
Anion gap: 5 (ref 5–15)
BUN: 15 mg/dL (ref 6–20)
CALCIUM: 8.3 mg/dL — AB (ref 8.9–10.3)
CO2: 28 mmol/L (ref 22–32)
CREATININE: 0.68 mg/dL (ref 0.44–1.00)
Chloride: 105 mmol/L (ref 101–111)
GFR calc Af Amer: >60 mL/min (ref >60)
GLUCOSE: 144 mg/dL — AB (ref 65–99)
POTASSIUM: 4.2 mmol/L (ref 3.5–5.1)
SODIUM: 138 mmol/L (ref 135–145)

## 2017-04-28 LAB — CBC
HCT: 38.7 % (ref 36.0–46.0)
Hemoglobin: 12.9 g/dL (ref 12.0–15.0)
MCH: 30.9 pg (ref 26.0–34.0)
MCHC: 33.3 g/dL (ref 30.0–36.0)
MCV: 92.6 fL (ref 78.0–100.0)
PLATELETS: 187 10*3/uL (ref 150–400)
RBC: 4.18 MIL/uL (ref 3.87–5.11)
RDW: 12.2 % (ref 11.5–15.5)
WBC: 11.7 10*3/uL — ABNORMAL HIGH (ref 4.0–10.5)

## 2017-04-28 MED ORDER — TRAMADOL HCL 50 MG PO TABS: 50.0000 mg | ORAL_TABLET | Freq: Four times a day (QID) | ORAL | 0 refills | Status: DC | PRN

## 2017-04-28 MED ORDER — ACETAMINOPHEN 500 MG PO TABS: 1000.0000 mg | ORAL_TABLET | Freq: Four times a day (QID) | ORAL | 0 refills | Status: DC

## 2017-04-28 MED ORDER — TRAMADOL HCL 50 MG PO TABS
50.0000 mg | ORAL_TABLET | Freq: Four times a day (QID) | ORAL | Status: DC | PRN
  Administered 2017-04-28 (×2): 50 mg via ORAL
  Filled 2017-04-28 (×2): qty 1

## 2017-04-28 MED ORDER — ACETAMINOPHEN 500 MG PO TABS
1000.0000 mg | ORAL_TABLET | Freq: Four times a day (QID) | ORAL | Status: DC
  Administered 2017-04-28: 1000 mg via ORAL
  Filled 2017-04-28: qty 2

## 2017-04-28 NOTE — Evaluation (Signed)
Occupational Therapy Evaluation Patient Details Name: Morgan Gregory MRN: [id number] DOB: [birth year] Today's Date: 04/28/2017    History of Present Illness 77 yo female s/p R TKA 04/27/17. Hx of L TKA 2016   Clinical Impression   OT education complete.  Pt has all needed DME and sister will A as needed    Follow Up Recommendations  No OT follow up    Equipment Recommendations  None recommended by OT       Precautions / Restrictions Precautions Precautions: Knee;Fall Restrictions Weight Bearing Restrictions: No RLE Weight Bearing: Weight bearing as tolerated      Mobility Bed Mobility Overal bed mobility: Needs Assistance Bed Mobility: Supine to Sit     Supine to sit: Min guard     General bed mobility comments: pt in chair  Transfers Overall transfer level: Needs assistance Equipment used: Rolling walker (2 wheeled) Transfers: Sit to/from [employer] Sit to Stand: Min guard Stand pivot transfers: Min guard       General transfer comment: VC for safety         ADL either performed or assessed with clinical judgement   ADL Overall ADL's : Needs assistance/impaired Eating/Feeding: Set up;Sitting   Grooming: Set up;Sitting   Upper Body Bathing: Set up;Sitting   Lower Body Bathing: Minimal assistance;Sit to/from stand;Cueing for safety;Cueing for sequencing   Upper Body Dressing : Set up;Sitting   Lower Body Dressing: Minimal assistance;Sit to/from stand;Cueing for safety;Cueing for sequencing   Toilet Transfer: Min guard;RW;Comfort height toilet;Ambulation   Toileting- Clothing Manipulation and Hygiene: Min guard;Sit to/from stand;Cueing for safety;Cueing for sequencing   Tub/ Shower Transfer: Copy Details (indicate cue type and reason): verbalized safety with walk in shower. sister will be with pt as needed Functional mobility during ADLs: Supervision/safety       Vision Patient Visual Report: No  change from baseline       Perception     Praxis      Pertinent Vitals/Pain Pain Assessment: 0-10 Pain Score: 4  Pain Location: R knee Pain Descriptors / Indicators: Sore Pain Intervention(s): Limited activity within patient's tolerance     Hand Dominance     Extremity/Trunk Assessment Upper Extremity Assessment Upper Extremity Assessment: Overall WFL for tasks assessed           Communication Communication Communication: No difficulties   Cognition Arousal/Alertness: Awake/alert Behavior During Therapy: WFL for tasks assessed/performed Overall Cognitive Status: Within Functional Limits for tasks assessed                                 General Comments: pt did find word finding diffuculties- pt attributed to medication   General Comments          Shoulder Instructions      Home Living Family/patient expects to be discharged to:: Private residence Living Arrangements: Alone Available Help at Discharge: Family(sister) Type of Home: House Home Access: Stairs to enter [employer] of Steps: 2 Entrance Stairs-Rails: Right;Left Home Layout: Able to live on main level with bedroom/bathroom               Home Equipment: Walker - 2 wheels;Crutches;Bedside commode          Prior Functioning/Environment Level of Independence: Independent                 OT Problem List:        OT Treatment/Interventions:  OT Goals(Current goals can be found in the care plan section) Acute Rehab OT Goals Patient Stated Goal: home. regain independence OT Goal Formulation: With patient  OT Frequency:                AM-PAC PT "6 Clicks" Daily Activity     Outcome Measure Help from another person eating meals?: None Help from another person taking care of personal grooming?: None Help from another person toileting, which includes using toliet, bedpan, or urinal?: A Little Help from another person bathing (including washing,  rinsing, drying)?: A Little Help from another person to put on and taking off regular upper body clothing?: None Help from another person to put on and taking off regular lower body clothing?: A Little 6 Click Score: 21   End of Session Nurse Communication: Mobility status  Activity Tolerance: Patient tolerated treatment well Patient left: in chair                   Time: 9983-3825 OT Time Calculation (min): 34 min Charges:  OT General Charges $OT Visit: 1 Visit OT Evaluation $OT Eval Moderate Complexity: 1 Mod OT Treatments $Self Care/Home Management : 8-22 mins G-Codes:     Kari Baars, OT [phone]  Payton Mccallum D 04/28/2017, 1:41 PM

## 2017-04-28 NOTE — Progress Notes (Signed)
Physical Therapy Treatment Patient Details Name: Morgan Gregory MRN: [id number] DOB: [birth year] Today's Date: 04/28/2017    History of Present Illness 77 yo female s/p R TKA 04/27/17. Hx of L TKA 77    PT Comments    Progressing with mobility. Pt appears a little less sharp possibly due to meds. She participated well. Will plan to have a 2nd session to practice stair negotiation. Plan is for a possible d/c home later today if pt performs well.     Follow Up Recommendations  DC plan and follow up therapy as arranged by surgeon     Equipment Recommendations  None recommended by PT    Recommendations for Other Services       Precautions / Restrictions Precautions Precautions: Knee;Fall Restrictions Weight Bearing Restrictions: No RLE Weight Bearing: Weight bearing as tolerated    Mobility  Bed Mobility Overal bed mobility: Needs Assistance Bed Mobility: Supine to Sit     Supine to sit: Min guard     General bed mobility comments: close guard for safety.   Transfers Overall transfer level: Needs assistance Equipment used: Rolling walker (2 wheeled) Transfers: Sit to/from Stand Sit to Stand: Min assist         General transfer comment: Assist to rise, stabilize, control descent. VCs safety, technique, hand/LE placement.   Ambulation/Gait Ambulation/Gait assistance: Min guard Ambulation Distance (Feet): 60 Feet Assistive device: Rolling walker (2 wheeled) Gait Pattern/deviations: Step-to pattern;Antalgic;Decreased stance time - right;Trunk flexed     General Gait Details: Close guard for safety. VCs safety, technique, sequence.   Stairs            Wheelchair Mobility    Modified Rankin (Stroke Patients Only)       Balance                                            Cognition Arousal/Alertness: Awake/alert Behavior During Therapy: WFL for tasks assessed/performed Overall Cognitive Status: Within Functional Limits for tasks  assessed                                        Exercises Total Joint Exercises Ankle Circles/Pumps: AROM;Both;10 reps;Supine Quad Sets: AROM;Both;10 reps;Supine Straight Leg Raises: AAROM;Right;10 reps;Supine Knee Flexion: AAROM;Right;10 reps;Seated Goniometric ROM: ~10-80 degrees    General Comments        Pertinent Vitals/Pain Pain Assessment: 0-10 Pain Score: 8  Pain Location: R knee Pain Descriptors / Indicators: Sore Pain Intervention(s): Limited activity within patient's tolerance    Home Living                      Prior Function            PT Goals (current goals can now be found in the care plan section) Progress towards PT goals: Progressing toward goals    Frequency    7X/week      PT Plan Current plan remains appropriate    Co-evaluation              AM-PAC PT "6 Clicks" Daily Activity  Outcome Measure  Difficulty turning over in bed (including adjusting bedclothes, sheets and blankets)?: A Little Difficulty moving from lying on back to sitting on the side of the bed? : A Little Difficulty sitting  down on and standing up from a chair with arms (e.g., wheelchair, bedside commode, etc,.)?: Unable Help needed moving to and from a bed to chair (including a wheelchair)?: A Little Help needed walking in hospital room?: A Little Help needed climbing 3-5 steps with a railing? : A Little 6 Click Score: 16    End of Session Equipment Utilized During Treatment: Gait belt Activity Tolerance: Patient tolerated treatment well Patient left: in chair;with call bell/phone within reach;with chair alarm set   PT Visit Diagnosis: Muscle weakness (generalized) (M62.81);Difficulty in walking, not elsewhere classified (R26.2)     Time: 1010-1040 PT Time Calculation (min) (ACUTE ONLY): 30 min  Charges:  $Gait Training: 8-22 mins $Therapeutic Exercise: 8-22 mins                    G Codes:          Weston Anna,  MPT Pager: [phone]

## 2017-04-28 NOTE — Discharge Summary (Signed)
Physician Discharge Summary  Patient ID: Morgan Gregory MRN: [id number] DOB/AGE: 06-19-1940 77 y.o.  Admit date: 04/27/2017 Discharge date:  04/28/2017  Procedures:  Procedure(s) (LRB): RIGHT TOTAL KNEE ARTHROPLASTY (Right)  Attending Physician:  Dr. Paralee Cancel   Admission Diagnoses:   Right knee primary OA / pain  Discharge Diagnoses:  Principal Problem:   S/P right TKA  Past Medical History:  Diagnosis Date  . Arthritis   . Cancer ([place]) 1968   melanoma on right shin  . Colon polyps   . DDD (degenerative disc disease), cervical 03/29/2009   DG Cervical Spine Complete  . Diverticulosis of sigmoid colon 01/2017  . Edema    right lower leg/foot due to s/p melanoma excision  . Gastritis   . GERD (gastroesophageal reflux disease)    03-05-15 not an issue now  . History of kidney stones   . Hypothyroidism   . IBS (irritable bowel syndrome)   . Spondylosis 03/29/2009   DG Cervical Spine Complete  . Thyroid disease    hypothyroid    HPI:    Morgan Gregory, 77 y.o. female, has a history of pain and functional disability in the right knee due to arthritis and has failed non-surgical conservative treatments for greater than 12 weeks to include NSAID's and/or analgesics, corticosteriod injections and activity modification.  Onset of symptoms was abrupt, starting ~1 years ago with gradually worsening course since that time. The patient noted prior procedures on the knee to include  arthroplasty on the left knee(s).  Patient currently rates pain in the right knee(s) at 9 out of 10 with activity. Patient has worsening of pain with activity and weight bearing, pain that interferes with activities of daily living, pain with passive range of motion, crepitus and joint swelling.  Patient has evidence of periarticular osteophytes and joint space narrowing by imaging studies.  There is no active infection.  Risks, benefits and expectations were discussed with the patient.  Risks  including but not limited to the risk of anesthesia, blood clots, nerve damage, blood vessel damage, failure of the prosthesis, infection and up to and including death.  Patient understand the risks, benefits and expectations and wishes to proceed with surgery.   PCP: Kelton Pillar, MD   Discharged Condition: good  Hospital Course:  Patient underwent the above stated procedure on 04/27/2017. Patient tolerated the procedure well and brought to the recovery room in good condition and subsequently to the floor.  POD #1 BP: 121/58 ; Pulse: 62 ; Temp: 97.9 F (36.6 C) ; Resp: 17 Patient reports pain as mild, pain controlled. Had some nausea yesterday, but doing much better this morning. She feels that she is significantly better than she was last night.  Ready to be discharged home. Dorsiflexion/plantar flexion intact, incision: dressing C/D/I, no cellulitis present and compartment soft.   LABS  Basename    HGB     12.9  HCT     38.7    Discharge Exam: General appearance: alert, cooperative and no distress Extremities: Homans sign is negative, no sign of DVT, no edema, redness or tenderness in the calves or thighs and no ulcers, gangrene or trophic changes  Disposition: Home with follow up in 2 weeks   Follow-up Information    Paralee Cancel, MD. Schedule an appointment as soon as possible for a visit in 2 week(s).   Specialty:  Orthopedic Surgery Contact information: [address] [place] [address] [phone]  Discharge Instructions    Call MD / Call 911   Complete by:  As directed    If you experience chest pain or shortness of breath, CALL 911 and be transported to the hospital emergency room.  If you develope a fever above 101 F, pus (white drainage) or increased drainage or redness at the wound, or calf pain, call your surgeon's office.   Change dressing   Complete by:  As directed    Maintain surgical dressing until follow up in the  clinic. If the edges start to pull up, may reinforce with tape. If the dressing is no longer working, may remove and cover with gauze and tape, but must keep the area dry and clean.  Call with any questions or concerns.   Constipation Prevention   Complete by:  As directed    Drink plenty of fluids.  Prune juice may be helpful.  You may use a stool softener, such as Colace (over the counter) 100 mg twice a day.  Use MiraLax (over the counter) for constipation as needed.   Diet - low sodium heart healthy   Complete by:  As directed    Discharge instructions   Complete by:  As directed    Maintain surgical dressing until follow up in the clinic. If the edges start to pull up, may reinforce with tape. If the dressing is no longer working, may remove and cover with gauze and tape, but must keep the area dry and clean.  Follow up in 2 weeks at [facility]. Call with any questions or concerns.   Increase activity slowly as tolerated   Complete by:  As directed    Weight bearing as tolerated with assist device (walker, cane, etc) as directed, use it as long as suggested by your surgeon or therapist, typically at least 4-6 weeks.   TED hose   Complete by:  As directed    Use stockings (TED hose) for 2 weeks on both leg(s).  You may remove them at night for sleeping.      Allergies as of 04/28/2017      Reactions   Augmentin [amoxicillin-pot Clavulanate] Diarrhea, Nausea Only   Has patient had a PCN reaction causing immediate rash, facial/tongue/throat swelling, SOB or lightheadedness with hypotension: No Has patient had a PCN reaction causing severe rash involving mucus membranes or skin necrosis: No Has patient had a PCN reaction that required hospitalization No Has patient had a PCN reaction occurring within the last 10 years: No If all of the above answers are "NO", then may proceed with Cephalosporin use.   Erythromycin Diarrhea, Nausea Only   Severe diarrhea      Medication List      STOP taking these medications   acetaminophen 500 MG tablet Commonly known as:  TYLENOL   naproxen sodium 220 MG tablet Commonly known as:  ALEVE     TAKE these medications   aspirin 81 MG chewable tablet Commonly known as:  ASPIRIN CHILDRENS Chew 1 tablet (81 mg total) by mouth 2 (two) times daily.   cholecalciferol 1000 units tablet Commonly known as:  VITAMIN D Take 1,000 Units by mouth daily.   docusate sodium 100 MG capsule Commonly known as:  COLACE Take 1 capsule (100 mg total) by mouth 2 (two) times daily.   ferrous sulfate 325 (65 FE) MG tablet Commonly known as:  FERROUSUL Take 1 tablet (325 mg total) by mouth 3 (three) times daily with meals.   HYDROcodone-acetaminophen 7.5-325 MG  tablet Commonly known as:  NORCO Take 1-2 tablets by mouth every 4 (four) hours as needed for moderate pain or severe pain.   levothyroxine 50 MCG tablet Commonly known as:  SYNTHROID, LEVOTHROID Take 50 mcg by mouth daily.   methocarbamol 500 MG tablet Commonly known as:  ROBAXIN Take 1 tablet (500 mg total) by mouth every 6 (six) hours as needed for muscle spasms.   polyethylene glycol packet Commonly known as:  MIRALAX / GLYCOLAX Take 17 g by mouth 2 (two) times daily.   SYSTANE OP Apply 1 drop to eye 2 (two) times daily as needed (dry eyes).   zolpidem 10 MG tablet Commonly known as:  AMBIEN Take 5 mg by mouth at bedtime.            Discharge Care Instructions  (From admission, onward)        Start     Ordered   04/28/17 0000  Change dressing    Comments:  Maintain surgical dressing until follow up in the clinic. If the edges start to pull up, may reinforce with tape. If the dressing is no longer working, may remove and cover with gauze and tape, but must keep the area dry and clean.  Call with any questions or concerns.   04/28/17 4585       Signed: [place] Pugh. Jenel Gierke   PA-C  04/28/2017, 9:21 AM

## 2017-04-28 NOTE — Progress Notes (Signed)
     Subjective: 1 Day Post-Op Procedure(s) (LRB): RIGHT TOTAL KNEE ARTHROPLASTY (Right)   Patient reports pain as mild, pain controlled. Had some nausea yesterday, but doing much better this morning. She feels that she is significantly better than she was last night.  Ready to be discharged home.  Objective:   VITALS:   Vitals:   04/28/17 0123 04/28/17 0531  BP: 133/68 (!) 121/58  Pulse: (!) 58 62  Resp: 16 17  Temp: 97.7 F (36.5 C) 97.9 F (36.6 C)  SpO2: 98% 99%    Dorsiflexion/Plantar flexion intact Incision: dressing C/D/I No cellulitis present Compartment soft  LABS Recent Labs    04/28/17 0555  HGB 12.9  HCT 38.7  WBC 11.7*  PLT 187    Recent Labs    04/28/17 0555  NA 138  K 4.2  BUN 15  CREATININE 0.68  GLUCOSE 144*     Assessment/Plan: 1 Day Post-Op Procedure(s) (LRB): RIGHT TOTAL KNEE ARTHROPLASTY (Right) Foley cath d/c'ed Advance diet Up with therapy D/C IV fluids Discharge home Follow up in 2 weeks at [facility]. Follow up with OLIN,Jhan Conery D in 2 weeks.  Contact information:  [facility] [address] [place] Laurie [place]ado   PAC  04/28/2017, 9:06 AM

## 2017-04-28 NOTE — Progress Notes (Signed)
Physical Therapy Treatment Patient Details Name: Morgan Gregory MRN: [id number] DOB: [birth year] Today's Date: 04/28/2017    History of Present Illness 77 yo female s/p R TKA 04/27/17. Hx of L TKA 2016    PT Comments    Progressing with mobility. Pt still has some mild cognition issues (likely due to meds)  however she participated well this session. Practiced gait training and stair negotiation training. Issued HEP for pt to perform 2x/day until she begins OPPT. All education completed. Okay to d/c from PT standpoint-made RN aware.     Follow Up Recommendations  DC plan and follow up therapy as arranged by surgeon; 24 hour supervision/assist     Equipment Recommendations  None recommended by PT    Recommendations for Other Services       Precautions / Restrictions Precautions Precautions: Fall;Knee Restrictions Weight Bearing Restrictions: No RLE Weight Bearing: Weight bearing as tolerated    Mobility  Bed Mobility Overal bed mobility: Needs Assistance Bed Mobility: Sit to Supine      Sit to supine: Min assist   General bed mobility comments: Assist for R LE   Transfers Overall transfer level: Needs assistance Equipment used: Rolling walker (2 wheeled) Transfers: Sit to/from Stand Sit to Stand: Min guard Stand pivot transfers: Min guard       General transfer comment: close guard for safety. VCs safety, hand placement  Ambulation/Gait Ambulation/Gait assistance: Min guard Ambulation Distance (Feet): 75 Feet Assistive device: Rolling walker (2 wheeled) Gait Pattern/deviations: Step-to pattern;Antalgic     General Gait Details: Close guard for safety. VCs safety, technique, sequence.   Stairs Stairs: Yes Min Assist Stair Management: Step to pattern;With crutches;One rail Left Number of Stairs: 2 General stair comments: Practiced with 1 crutch, 1 rail. VCs safety, technique, sequence. Assist to stabilize.   Wheelchair Mobility    Modified Rankin  (Stroke Patients Only)       Balance                                            Cognition Arousal/Alertness: Awake/alert Behavior During Therapy: WFL for tasks assessed/performed Overall Cognitive Status: Within Functional Limits for tasks assessed                                 General Comments: pt did find word finding diffuculties- pt attributed to medication      Exercises Total Joint Exercises Ankle Circles/Pumps: AROM;Both;10 reps;Supine Quad Sets: AROM;Both;10 reps;Supine Straight Leg Raises: AAROM;Right;10 reps;Supine Knee Flexion: AAROM;Right;10 reps;Seated Goniometric ROM: ~10-80 degrees    General Comments        Pertinent Vitals/Pain Pain Assessment: 0-10 Pain Score: 7  Pain Location: R knee Pain Descriptors / Indicators: Aching;Sore Pain Intervention(s): Monitored during session;Repositioned    Home Living Family/patient expects to be discharged to:: Private residence Living Arrangements: Alone Available Help at Discharge: Family(sister) Type of Home: House Home Access: Stairs to enter Entrance Stairs-Rails: Right;Left Home Layout: Able to live on main level with bedroom/bathroom Home Equipment: Walker - 2 wheels;Crutches;Bedside commode      Prior Function Level of Independence: Independent          PT Goals (current goals can now be found in the care plan section) Acute Rehab PT Goals Patient Stated Goal: home. regain independence Progress towards PT goals: Progressing toward goals  Frequency    7X/week      PT Plan Current plan remains appropriate    Co-evaluation              AM-PAC PT "6 Clicks" Daily Activity  Outcome Measure  Difficulty turning over in bed (including adjusting bedclothes, sheets and blankets)?: A Little Difficulty moving from lying on back to sitting on the side of the bed? : Unable Difficulty sitting down on and standing up from a chair with arms (e.g., wheelchair,  bedside commode, etc,.)?: A Little Help needed moving to and from a bed to chair (including a wheelchair)?: A Little Help needed walking in hospital room?: A Little Help needed climbing 3-5 steps with a railing? : A Little 6 Click Score: 16    End of Session Equipment Utilized During Treatment: Gait belt Activity Tolerance: Patient tolerated treatment well Patient left: in bed;with call bell/phone within reach;with bed alarm set   PT Visit Diagnosis: Muscle weakness (generalized) (M62.81);Difficulty in walking, not elsewhere classified (R26.2)     Time: 1330-1410 PT Time Calculation (min) (ACUTE ONLY): 40 min  Charges:  $Gait Training: 38-52 mins $Therapeutic Exercise: 8-22 mins                    G Codes:          Weston Anna, MPT Pager: [phone]

## 2017-04-30 DIAGNOSIS — M25661 Stiffness of right knee, not elsewhere classified: Secondary | ICD-10-CM | POA: Diagnosis not present

## 2017-05-03 DIAGNOSIS — M25661 Stiffness of right knee, not elsewhere classified: Secondary | ICD-10-CM | POA: Diagnosis not present

## 2017-05-05 DIAGNOSIS — M25661 Stiffness of right knee, not elsewhere classified: Secondary | ICD-10-CM | POA: Diagnosis not present

## 2017-05-10 DIAGNOSIS — M25661 Stiffness of right knee, not elsewhere classified: Secondary | ICD-10-CM | POA: Diagnosis not present

## 2017-05-14 DIAGNOSIS — M25661 Stiffness of right knee, not elsewhere classified: Secondary | ICD-10-CM | POA: Diagnosis not present

## 2017-05-17 DIAGNOSIS — M25661 Stiffness of right knee, not elsewhere classified: Secondary | ICD-10-CM | POA: Diagnosis not present

## 2017-05-19 DIAGNOSIS — M25661 Stiffness of right knee, not elsewhere classified: Secondary | ICD-10-CM | POA: Diagnosis not present

## 2017-05-21 DIAGNOSIS — M25661 Stiffness of right knee, not elsewhere classified: Secondary | ICD-10-CM | POA: Diagnosis not present

## 2017-05-24 DIAGNOSIS — M25661 Stiffness of right knee, not elsewhere classified: Secondary | ICD-10-CM | POA: Diagnosis not present

## 2017-05-26 DIAGNOSIS — M25661 Stiffness of right knee, not elsewhere classified: Secondary | ICD-10-CM | POA: Diagnosis not present

## 2017-05-28 DIAGNOSIS — M25661 Stiffness of right knee, not elsewhere classified: Secondary | ICD-10-CM | POA: Diagnosis not present

## 2017-05-31 DIAGNOSIS — M25661 Stiffness of right knee, not elsewhere classified: Secondary | ICD-10-CM | POA: Diagnosis not present

## 2017-06-02 DIAGNOSIS — M25661 Stiffness of right knee, not elsewhere classified: Secondary | ICD-10-CM | POA: Diagnosis not present

## 2017-06-04 DIAGNOSIS — M25661 Stiffness of right knee, not elsewhere classified: Secondary | ICD-10-CM | POA: Diagnosis not present

## 2017-06-07 DIAGNOSIS — M25661 Stiffness of right knee, not elsewhere classified: Secondary | ICD-10-CM | POA: Diagnosis not present

## 2017-06-09 DIAGNOSIS — M25661 Stiffness of right knee, not elsewhere classified: Secondary | ICD-10-CM | POA: Diagnosis not present

## 2017-06-10 DIAGNOSIS — Z471 Aftercare following joint replacement surgery: Secondary | ICD-10-CM | POA: Diagnosis not present

## 2017-06-10 DIAGNOSIS — Z96651 Presence of right artificial knee joint: Secondary | ICD-10-CM | POA: Diagnosis not present

## 2017-06-22 DIAGNOSIS — L812 Freckles: Secondary | ICD-10-CM | POA: Diagnosis not present

## 2017-06-22 DIAGNOSIS — L821 Other seborrheic keratosis: Secondary | ICD-10-CM | POA: Diagnosis not present

## 2017-06-22 DIAGNOSIS — Z85820 Personal history of malignant melanoma of skin: Secondary | ICD-10-CM | POA: Diagnosis not present

## 2017-07-15 ENCOUNTER — Other Ambulatory Visit: Payer: Self-pay | Admitting: Family Medicine

## 2017-07-15 DIAGNOSIS — Z1231 Encounter for screening mammogram for malignant neoplasm of breast: Secondary | ICD-10-CM

## 2017-08-23 ENCOUNTER — Ambulatory Visit
Admission: RE | Admit: 2017-08-23 | Discharge: 2017-08-23 | Disposition: A | Discharge status: Home / Self Care | Payer: Medicare Other | Source: Ambulatory Visit | Attending: Family Medicine | Admitting: Family Medicine

## 2017-08-23 DIAGNOSIS — Z1231 Encounter for screening mammogram for malignant neoplasm of breast: Secondary | ICD-10-CM | POA: Diagnosis not present

## 2017-09-29 DIAGNOSIS — M1711 Unilateral primary osteoarthritis, right knee: Secondary | ICD-10-CM | POA: Diagnosis not present

## 2017-10-12 DIAGNOSIS — E039 Hypothyroidism, unspecified: Secondary | ICD-10-CM | POA: Diagnosis not present

## 2017-10-18 DIAGNOSIS — M79672 Pain in left foot: Secondary | ICD-10-CM | POA: Diagnosis not present

## 2017-10-18 DIAGNOSIS — M25872 Other specified joint disorders, left ankle and foot: Secondary | ICD-10-CM | POA: Diagnosis not present

## 2017-10-18 DIAGNOSIS — M7742 Metatarsalgia, left foot: Secondary | ICD-10-CM | POA: Diagnosis not present

## 2017-10-18 DIAGNOSIS — Z978 Presence of other specified devices: Secondary | ICD-10-CM | POA: Diagnosis not present

## 2017-11-09 ENCOUNTER — Other Ambulatory Visit: Payer: Self-pay | Admitting: Orthopedic Surgery

## 2017-11-09 DIAGNOSIS — M898X7 Other specified disorders of bone, ankle and foot: Secondary | ICD-10-CM | POA: Diagnosis not present

## 2017-11-09 DIAGNOSIS — M79672 Pain in left foot: Secondary | ICD-10-CM | POA: Diagnosis not present

## 2017-11-09 DIAGNOSIS — D481 Neoplasm of uncertain behavior of connective and other soft tissue: Secondary | ICD-10-CM | POA: Diagnosis not present

## 2017-11-09 DIAGNOSIS — D2122 Benign neoplasm of connective and other soft tissue of left lower limb, including hip: Secondary | ICD-10-CM | POA: Diagnosis not present

## 2017-11-09 DIAGNOSIS — T8484XA Pain due to internal orthopedic prosthetic devices, implants and grafts, initial encounter: Secondary | ICD-10-CM | POA: Diagnosis not present

## 2017-11-09 DIAGNOSIS — M7742 Metatarsalgia, left foot: Secondary | ICD-10-CM | POA: Diagnosis not present

## 2017-12-08 DIAGNOSIS — H26491 Other secondary cataract, right eye: Secondary | ICD-10-CM | POA: Diagnosis not present

## 2017-12-15 DIAGNOSIS — H26492 Other secondary cataract, left eye: Secondary | ICD-10-CM | POA: Diagnosis not present

## 2017-12-21 DIAGNOSIS — Z85820 Personal history of malignant melanoma of skin: Secondary | ICD-10-CM | POA: Diagnosis not present

## 2017-12-21 DIAGNOSIS — D1801 Hemangioma of skin and subcutaneous tissue: Secondary | ICD-10-CM | POA: Diagnosis not present

## 2017-12-21 DIAGNOSIS — L821 Other seborrheic keratosis: Secondary | ICD-10-CM | POA: Diagnosis not present

## 2018-01-09 DIAGNOSIS — N390 Urinary tract infection, site not specified: Secondary | ICD-10-CM | POA: Diagnosis not present

## 2018-01-13 DIAGNOSIS — R3915 Urgency of urination: Secondary | ICD-10-CM | POA: Diagnosis not present

## 2018-02-20 DIAGNOSIS — Z23 Encounter for immunization: Secondary | ICD-10-CM | POA: Diagnosis not present

## 2018-04-04 ENCOUNTER — Other Ambulatory Visit: Payer: Self-pay | Admitting: Family Medicine

## 2018-04-04 DIAGNOSIS — D34 Benign neoplasm of thyroid gland: Secondary | ICD-10-CM | POA: Diagnosis not present

## 2018-04-04 DIAGNOSIS — M810 Age-related osteoporosis without current pathological fracture: Secondary | ICD-10-CM

## 2018-04-04 DIAGNOSIS — Z0000 Encounter for general adult medical examination without abnormal findings: Secondary | ICD-10-CM | POA: Diagnosis not present

## 2018-04-04 DIAGNOSIS — E039 Hypothyroidism, unspecified: Secondary | ICD-10-CM | POA: Diagnosis not present

## 2018-04-25 DIAGNOSIS — H524 Presbyopia: Secondary | ICD-10-CM | POA: Diagnosis not present

## 2018-05-04 DIAGNOSIS — Z471 Aftercare following joint replacement surgery: Secondary | ICD-10-CM | POA: Diagnosis not present

## 2018-05-04 DIAGNOSIS — Z96651 Presence of right artificial knee joint: Secondary | ICD-10-CM | POA: Diagnosis not present

## 2018-05-04 DIAGNOSIS — M79672 Pain in left foot: Secondary | ICD-10-CM | POA: Diagnosis not present

## 2018-05-31 ENCOUNTER — Ambulatory Visit
Admission: RE | Admit: 2018-05-31 | Discharge: 2018-05-31 | Disposition: A | Discharge status: Home / Self Care | Payer: BLUE CROSS/BLUE SHIELD | Source: Ambulatory Visit | Attending: Family Medicine | Admitting: Family Medicine

## 2018-05-31 DIAGNOSIS — M810 Age-related osteoporosis without current pathological fracture: Secondary | ICD-10-CM

## 2018-05-31 DIAGNOSIS — M8589 Other specified disorders of bone density and structure, multiple sites: Secondary | ICD-10-CM | POA: Diagnosis not present

## 2018-05-31 DIAGNOSIS — Z780 Asymptomatic menopausal state: Secondary | ICD-10-CM | POA: Diagnosis not present

## 2018-06-28 DIAGNOSIS — L718 Other rosacea: Secondary | ICD-10-CM | POA: Diagnosis not present

## 2018-06-28 DIAGNOSIS — Z85820 Personal history of malignant melanoma of skin: Secondary | ICD-10-CM | POA: Diagnosis not present

## 2018-06-28 DIAGNOSIS — L812 Freckles: Secondary | ICD-10-CM | POA: Diagnosis not present

## 2018-06-28 DIAGNOSIS — L821 Other seborrheic keratosis: Secondary | ICD-10-CM | POA: Diagnosis not present

## 2018-10-04 ENCOUNTER — Other Ambulatory Visit: Payer: Self-pay | Admitting: Family Medicine

## 2018-10-04 DIAGNOSIS — Z1231 Encounter for screening mammogram for malignant neoplasm of breast: Secondary | ICD-10-CM

## 2018-10-07 ENCOUNTER — Ambulatory Visit: Payer: BLUE CROSS/BLUE SHIELD

## 2018-10-28 ENCOUNTER — Ambulatory Visit
Admission: RE | Admit: 2018-10-28 | Discharge: 2018-10-28 | Disposition: A | Discharge status: Home / Self Care | Payer: Medicare Other | Source: Ambulatory Visit | Attending: Family Medicine | Admitting: Family Medicine

## 2018-10-28 ENCOUNTER — Other Ambulatory Visit: Payer: Self-pay

## 2018-10-28 DIAGNOSIS — Z1231 Encounter for screening mammogram for malignant neoplasm of breast: Secondary | ICD-10-CM

## 2018-11-11 DIAGNOSIS — Z209 Contact with and (suspected) exposure to unspecified communicable disease: Secondary | ICD-10-CM | POA: Diagnosis not present

## 2019-01-03 DIAGNOSIS — Z85820 Personal history of malignant melanoma of skin: Secondary | ICD-10-CM | POA: Diagnosis not present

## 2019-01-03 DIAGNOSIS — L821 Other seborrheic keratosis: Secondary | ICD-10-CM | POA: Diagnosis not present

## 2019-01-03 DIAGNOSIS — D225 Melanocytic nevi of trunk: Secondary | ICD-10-CM | POA: Diagnosis not present

## 2019-01-03 DIAGNOSIS — D2371 Other benign neoplasm of skin of right lower limb, including hip: Secondary | ICD-10-CM | POA: Diagnosis not present

## 2019-04-25 ENCOUNTER — Ambulatory Visit
Admission: RE | Admit: 2019-04-25 | Discharge: 2019-04-25 | Disposition: A | Discharge status: Home / Self Care | Payer: Medicare Other | Source: Ambulatory Visit | Attending: Family Medicine | Admitting: Family Medicine

## 2019-04-25 ENCOUNTER — Other Ambulatory Visit: Payer: Self-pay | Admitting: Family Medicine

## 2019-04-25 DIAGNOSIS — R350 Frequency of micturition: Secondary | ICD-10-CM | POA: Diagnosis not present

## 2019-04-25 DIAGNOSIS — E039 Hypothyroidism, unspecified: Secondary | ICD-10-CM | POA: Diagnosis not present

## 2019-04-25 DIAGNOSIS — G4700 Insomnia, unspecified: Secondary | ICD-10-CM | POA: Diagnosis not present

## 2019-04-25 DIAGNOSIS — R0609 Other forms of dyspnea: Secondary | ICD-10-CM

## 2019-04-25 DIAGNOSIS — Z0000 Encounter for general adult medical examination without abnormal findings: Secondary | ICD-10-CM | POA: Diagnosis not present

## 2019-05-01 DIAGNOSIS — Z961 Presence of intraocular lens: Secondary | ICD-10-CM | POA: Diagnosis not present

## 2019-05-04 ENCOUNTER — Encounter: Payer: Self-pay | Admitting: Cardiology

## 2019-05-04 NOTE — Progress Notes (Addendum)
Cardiology Office Note   Date:  05/05/2019   ID:  Morgan Gregory, Morgan Gregory 10/19/1940, MRN [id number]  PCP:  Kelton Pillar, MD  Cardiologist:   No primary care provider on file.   No chief complaint on file.     History of Present Illness: Morgan Gregory is a 79 y.o. female who is referred by Kelton Pillar, MD presents for evaluation of shortness of breath with activity.  She does not have past cardiac history.    She has noted for greater than 1 year that she has been getting short of breath with activity such as walking [place].  She walks routinely around her neighborhood.  She does not get chest pressure, neck or arm discomfort.  She does not get palpitations, presyncope or syncope.  She notices that she gets short of breath bending over to do things like empty the dishwasher or make the beds.  She is also had occasional lightheaded episodes of presyncope with diaphoresis doing something like standing in line.  She has never had any prior cardiac work-up.   Past Medical History:  Diagnosis Date  . Arthritis   . Cancer ([place]) 1968   melanoma on right shin  . Colon polyps   . DDD (degenerative disc disease), cervical 03/29/2009   DG Cervical Spine Complete  . Diverticulosis of sigmoid colon 01/2017  . Edema    right lower leg/foot due to s/p melanoma excision  . Gastritis   . GERD (gastroesophageal reflux disease)    03-05-15 not an issue now  . History of kidney stones   . Hypothyroidism   . IBS (irritable bowel syndrome)   . Spondylosis 03/29/2009   DG Cervical Spine Complete  . Thyroid disease    hypothyroid    Past Surgical History:  Procedure Laterality Date  . ABDOMINAL HYSTERECTOMY  1979  . CATARACT EXTRACTION, BILATERAL     8'15,. 9'15  . CERVICAL SPINE SURGERY  2007   posterior approach-no retained hardware  . COLONOSCOPY    . FOOT SURGERY Bilateral    hammertoe, neuromas, bunionectomy  . MELANOMA  EXCISION  1968   right shin/calf- calf measures 0.75 inches larger than left  . THYROIDECTOMY, PARTIAL  2011  . TONSILLECTOMY    . 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 TOTAL KNEE ARTHROPLASTY Right 04/27/2017   Procedure: RIGHT TOTAL KNEE ARTHROPLASTY;  Surgeon: Paralee Cancel, MD;  Location: [facility];  Service: Orthopedics;  Laterality: Right;  70 mins     Current Outpatient Medications  Medication Sig Dispense Refill  . acetaminophen (TYLENOL) 500 MG tablet Take 2 tablets (1,000 mg total) by mouth every 6 (six) hours. (Patient taking differently: Take 1,000 mg by mouth every 6 (six) hours as needed. ) 30 tablet 0  . cholecalciferol (VITAMIN D) 1000 UNITS tablet Take 1,000 Units by mouth daily.    Marland Kitchen docusate sodium (COLACE) 100 MG capsule Take 1 capsule (100 mg total) by mouth 2 (two) times daily. 10 capsule 0  . levothyroxine (SYNTHROID, LEVOTHROID) 50 MCG tablet Take 50 mcg by mouth daily.     Vladimir Faster Glycol-Propyl Glycol (SYSTANE OP) Apply 1 drop to eye 2 (two) times daily as needed (dry eyes).    . polyethylene glycol (MIRALAX / GLYCOLAX) packet Take 17 g by mouth 2 (two) times daily. (Patient taking  differently: Take 17 g by mouth daily as needed. ) 14 each 0  . zolpidem (AMBIEN) 10 MG tablet Take 5 mg by mouth at bedtime.      No current facility-administered medications for this visit.    Allergies:   Augmentin [amoxicillin-pot clavulanate] and Erythromycin    Social History:  The patient  reports that she quit smoking about 54 years ago. Her smoking use included cigarettes. She has never used smokeless tobacco. She reports current alcohol use of about 10.0 standard drinks of alcohol per week. She reports that she does not use drugs.   Family History:  The patient's family history includes Colon cancer (age of onset: 22) in her mother; Emphysema  in her father; Heart attack (age of onset: 42) in her brother.    ROS:  Please see the history of present illness.   Otherwise, review of systems are positive for none.   All other systems are reviewed and negative.    PHYSICAL EXAM: VS:  BP 114/72   Pulse 63   Ht 5' 3.25" (1.607 m)   Wt 141 lb (64 kg)   BMI 24.78 kg/m  , BMI Body mass index is 24.78 kg/m. GENERAL:  Well appearing HEENT:  Pupils equal round and reactive, fundi not visualized, oral mucosa unremarkable NECK:  No jugular venous distention, waveform within normal limits, carotid upstroke brisk and symmetric, no bruits, no thyromegaly LYMPHATICS:  No cervical, inguinal adenopathy LUNGS:  Clear to auscultation bilaterally BACK:  No CVA tenderness, scoliosis CHEST:  Unremarkable HEART:  PMI not displaced or sustained,S1 and S2 within normal limits, no S3, no S4, no clicks, no rubs, no murmurs ABD:  Flat, positive bowel sounds normal in frequency in pitch, no bruits, no rebound, no guarding, no midline pulsatile mass, no hepatomegaly, no splenomegaly EXT:  2 plus pulses throughout, no edema, no cyanosis no clubbing SKIN:  No rashes no nodules NEURO:  Cranial nerves II through XII grossly intact, motor grossly intact throughout PSYCH:  Cognitively intact, oriented to person place and time    EKG:  EKG is ordered today. The ekg ordered today demonstrates sinus rhythm, rate 63, axis within normal limits, intervals within normal limits, no acute ST-T wave changes.   Recent Labs: No results found for requested labs within last 8760 hours.    Lipid Panel No results found for: CHOL, TRIG, HDL, CHOLHDL, VLDL, LDLCALC, LDLDIRECT    Wt Readings from Last 3 Encounters:  05/05/19 141 lb (64 kg)  04/27/17 140 lb (63.5 kg)  04/22/17 140 lb (63.5 kg)      Other studies Reviewed: Additional studies/ records that were reviewed today include: Labs. Review of the above records demonstrates:  Please see elsewhere in the note.      ASSESSMENT AND PLAN:  DYSPNEA: She has minimal cardiovascular risk factors.  The EKG is unremarkable.  At this point I will start with a POET (Plain Old Exercise Treadmill).  I will check a BNP.  If this is normal I would suspect more likely a pulmonary etiology possibly related to some restrictive physiology given her scoliosis.  If the cardiac work-up is normal I would suggest pulmonary function testing.  PRESYNCOPE: The patient has had rare episodes of dizziness. This is more frequent.  Probably describing a vagal episode.  She will let me know if things get worse.  COVD EDUCATION: She has gotten dose one of the vaccine.   Current medicines are reviewed at length with the patient today.  The  patient does not have concerns regarding medicines.  The following changes have been made:  no change  Labs/ tests ordered today include:   Orders Placed This Encounter  Procedures  . B Nat Peptide  . Exercise Tolerance Test  . EKG 12-Lead     Disposition:   FU with me as needed.      Signed, Minus Breeding, MD  05/05/2019 2:41 PM    [facility]

## 2019-05-05 ENCOUNTER — Ambulatory Visit: Payer: Medicare Other | Admitting: Cardiology

## 2019-05-05 ENCOUNTER — Encounter: Payer: Self-pay | Admitting: Cardiology

## 2019-05-05 ENCOUNTER — Other Ambulatory Visit: Payer: Self-pay

## 2019-05-05 VITALS — BP 114/72 | HR 63 | Ht 63.25 in | Wt 141.0 lb

## 2019-05-05 DIAGNOSIS — R0602 Shortness of breath: Secondary | ICD-10-CM

## 2019-05-05 NOTE — Patient Instructions (Addendum)
Medication Instructions:  No changes *If you need a refill on your cardiac medications before your next appointment, please call your pharmacy*  Lab Work: Your physician recommends that you return for lab work today (BNP)  If you have labs (blood work) drawn today and your tests are completely normal, you will receive your results only by: Marland Kitchen MyChart Message (if you have MyChart) OR . A paper copy in the mail If you have any lab test that is abnormal or we need to change your treatment, we will call you to review the results.  Testing/Procedures: Your physician has requested that you have an exercise tolerance test. For further information please visit [email or web address]. Please also follow instruction sheet, as given.   You are scheduled for a Covid Screening 3 days before your exercise test.This is a Drive Up Visit at the [facility] [address] [place]. Someone will direct you to the appropriate testing line. Stay in your car and someone will be with you shortly   Follow-Up: At [facility], you and your health needs are our priority.  As part of our continuing mission to provide you with exceptional heart care, we have created designated Provider Care Teams.  These Care Teams include your primary Cardiologist (physician) and Advanced Practice Providers (APPs -  Physician Assistants and Nurse Practitioners) who all work together to provide you with the care you need, when you need it.  Follow up as needed with Dr. Percival Spanish.

## 2019-05-06 LAB — BRAIN NATRIURETIC PEPTIDE: BNP: 141.2 pg/mL — ABNORMAL HIGH (ref 0.0–100.0)

## 2019-05-11 ENCOUNTER — Telehealth (HOSPITAL_COMMUNITY): Payer: Self-pay

## 2019-05-11 NOTE — Telephone Encounter (Signed)
Encounter complete. 

## 2019-05-12 ENCOUNTER — Other Ambulatory Visit (HOSPITAL_COMMUNITY)
Admission: RE | Admit: 2019-05-12 | Discharge: 2019-05-12 | Disposition: A | Discharge status: Home / Self Care | Payer: Medicare Other | Source: Ambulatory Visit | Attending: Cardiovascular Disease | Admitting: Cardiovascular Disease

## 2019-05-12 DIAGNOSIS — Z20822 Contact with and (suspected) exposure to covid-19: Secondary | ICD-10-CM | POA: Diagnosis not present

## 2019-05-12 DIAGNOSIS — Z01812 Encounter for preprocedural laboratory examination: Secondary | ICD-10-CM | POA: Insufficient documentation

## 2019-05-12 LAB — SARS CORONAVIRUS 2 (TAT 6-24 HRS): SARS Coronavirus 2: NEGATIVE

## 2019-05-16 ENCOUNTER — Ambulatory Visit (HOSPITAL_COMMUNITY)
Admission: RE | Admit: 2019-05-16 | Discharge: 2019-05-16 | Disposition: A | Discharge status: Home / Self Care | Payer: Medicare Other | Source: Ambulatory Visit | Attending: Cardiovascular Disease | Admitting: Cardiovascular Disease

## 2019-05-16 ENCOUNTER — Other Ambulatory Visit: Payer: Self-pay

## 2019-05-16 DIAGNOSIS — R0602 Shortness of breath: Secondary | ICD-10-CM

## 2019-05-16 LAB — EXERCISE TOLERANCE TEST
Estimated workload: 7 METS
Exercise duration (min): 5 min
Exercise duration (sec): 13 s
MPHR: 140 {beats}/min
Peak HR: 115 {beats}/min
Percent HR: 82 %
Rest HR: 65 {beats}/min

## 2019-05-18 ENCOUNTER — Other Ambulatory Visit: Payer: Self-pay

## 2019-05-18 DIAGNOSIS — R0602 Shortness of breath: Secondary | ICD-10-CM

## 2019-05-18 NOTE — Progress Notes (Signed)
PFT order placed. Will send to scheduling to schedule at [facility]. Patient aware.

## 2019-05-26 ENCOUNTER — Telehealth: Payer: Self-pay | Admitting: Cardiology

## 2019-05-26 ENCOUNTER — Encounter: Payer: Self-pay | Admitting: Cardiovascular Disease

## 2019-05-26 NOTE — Telephone Encounter (Signed)
Patient is calling stating she would like a nurse to give her a call. She states she has a question about her BMET results and scheduling an appointment. Please advise.

## 2019-05-26 NOTE — Telephone Encounter (Signed)
error 

## 2019-05-26 NOTE — Telephone Encounter (Signed)
Patient calling- she states that she has questions regarding her last few tests, including her blood work- she advised that she would ask Dr.Hochrein but he states she did not need another appointment- but she has questions and concerns. I advised patient if she had that many concerns it would be better for an appointment with a provider to explain things to her a little better than previous nurses have tried.  Patient was very thankful for the call. I will route to NP who will see patient on 02/16.

## 2019-06-02 ENCOUNTER — Other Ambulatory Visit: Payer: Self-pay | Admitting: General Practice

## 2019-06-02 NOTE — Progress Notes (Signed)
Cardiology Clinic Note   Patient Name: Morgan Gregory Date of Encounter: 06/06/2019  Primary Care Provider:  Kelton Pillar, MD Primary Cardiologist:  Minus Breeding, MD  Patient Profile    Morgan Gregory 79 year old female presents today for follow-up of her dyspnea, presyncope, and to review her ETT results.  Past Medical History    Past Medical History:  Diagnosis Date  . Arthritis   . Cancer ([place]) 1968   melanoma on right shin  . Colon polyps   . DDD (degenerative disc disease), cervical 03/29/2009   DG Cervical Spine Complete  . Diverticulosis of sigmoid colon 01/2017  . Edema    right lower leg/foot due to s/p melanoma excision  . Gastritis   . GERD (gastroesophageal reflux disease)    03-05-15 not an issue now  . History of kidney stones   . Hypothyroidism   . IBS (irritable bowel syndrome)   . Spondylosis 03/29/2009   DG Cervical Spine Complete  . Thyroid disease    hypothyroid   Past Surgical History:  Procedure Laterality Date  . ABDOMINAL HYSTERECTOMY  1979  . CATARACT EXTRACTION, BILATERAL     8'15,. 9'15  . CERVICAL SPINE SURGERY  2007   posterior approach-no retained hardware  . COLONOSCOPY    . FOOT SURGERY Bilateral    hammertoe, neuromas, bunionectomy  . MELANOMA EXCISION  1968   right shin/calf- calf measures 0.75 inches larger than left  . THYROIDECTOMY, PARTIAL  2011  . TONSILLECTOMY    . 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 TOTAL KNEE ARTHROPLASTY Right 04/27/2017   Procedure: RIGHT TOTAL KNEE ARTHROPLASTY;  Surgeon: Paralee Cancel, MD;  Location: [facility];  Service: Orthopedics;  Laterality: Right;  70 mins    Allergies  Allergies  Allergen Reactions  . Augmentin [Amoxicillin-Pot Clavulanate] Diarrhea and Nausea Only    Has patient had a PCN reaction causing immediate rash,  facial/tongue/throat swelling, SOB or lightheadedness with hypotension: No Has patient had a PCN reaction causing severe rash involving mucus membranes or skin necrosis: No Has patient had a PCN reaction that required hospitalization No Has patient had a PCN reaction occurring within the last 10 years: No If all of the above answers are "NO", then may proceed with Cephalosporin use.   . Erythromycin Diarrhea and Nausea Only    Severe diarrhea    History of Present Illness    Ms. [name] was referred to the cardiology clinic by Dr. Laurann Montana for evaluation of her DOE.  She was not noted to have any past cardiac history.  She indicated that her DOE he has been present for around 1 year.  She noted increased work of breathing with walking up inclines in her neighborhood.  She indicated that she walked regularly.  She denied chest discomfort, neck and arm pain.  She denied palpitations, presyncope and syncope.  She did note that she would get short of breath with bending over to empty her dishwasher or with making beds.  She also indicated that she had occasional episodes of lightheadedness with diaphoresis during activities such as standing in line.  She was seen by Dr. Percival Spanish on 05/05/2019 and was sent for TTE which showed no ST segment deviation, she was noted to have frequent PVCs, hypertensive exercise response, no signs of ischemia at her maximum exertion level.  An EKG on 05/08/2019 showed  normal sinus rhythm.  Recommendation was made for PFTs.  She presents to the clinic today for follow-up evaluation and states she has had 2 episodes in the last week of shortness of breath.  Each episode lasted for around 20 minutes and went away on its own.  These were not anxiety induced and presented while she was at rest.  She states that she would typically have presented to the emergency department but due to the COVID-19 pandemic she wishes to manage this without going to the emergency department.  She  has right ankle +1 pitting edema.  She states that this edema has been present for around 20 years related to right lower extremity CA and treatment.  Her left ankle is not edematous.  Her ETT was reviewed and she was reassured that her increased work of breathing is not related to her heart.  Given her progressing symptoms we will pursue scheduling her PFTs as urgent.  She has maintained her neighborhood walking and does not have increased work of breathing with activity.  She also tries to follow a low-sodium diet.  Today she denies chest pain, increasing lower extremity edema, fatigue, palpitations,  weakness, presyncope,orthopnea, and PND.    Home Medications    Prior to Admission medications   Medication Sig Start Date End Date Taking? Authorizing Provider  acetaminophen (TYLENOL) 500 MG tablet Take 2 tablets (1,000 mg total) by mouth every 6 (six) hours. Patient taking differently: Take 1,000 mg by mouth every 6 (six) hours as needed.  04/28/17   Paralee Cancel, MD  cholecalciferol (VITAMIN D) 1000 UNITS tablet Take 1,000 Units by mouth daily.    [provider]  docusate sodium (COLACE) 100 MG capsule Take 1 capsule (100 mg total) by mouth 2 (two) times daily. 04/27/17   Danae Orleans, PA-C  levothyroxine (SYNTHROID, LEVOTHROID) 50 MCG tablet Take 50 mcg by mouth daily.  09/24/11   [provider]  Polyethyl Glycol-Propyl Glycol (SYSTANE OP) Apply 1 drop to eye 2 (two) times daily as needed (dry eyes).    [provider]  polyethylene glycol (MIRALAX / GLYCOLAX) packet Take 17 g by mouth 2 (two) times daily. Patient taking differently: Take 17 g by mouth daily as needed.  04/27/17   Danae Orleans, PA-C  zolpidem (AMBIEN) 10 MG tablet Take 5 mg by mouth at bedtime.  09/22/11   [provider]    Family History    Family History  Problem Relation Age of Onset  . Colon cancer Mother 50  . Emphysema Father   . Heart attack Brother 5  . Esophageal cancer  Neg Hx   . Rectal cancer Neg Hx   . Stomach cancer Neg Hx    She indicated that her mother is deceased. She indicated that her father is deceased. She indicated that her brother is deceased. She indicated that the status of her neg hx is unknown.  Social History    Social History   Socioeconomic History  . Marital status: Widowed    Spouse name: Not on file  . Number of children: Not on file  . Years of education: Not on file  . Highest education level: Not on file  Occupational History  . Not on file  Tobacco Use  . Smoking status: Former Smoker    Types: Cigarettes    Quit date: 03/04/1965    Years since quitting: 54.2  . Smokeless tobacco: Never Used  Substance and Sexual Activity  . Alcohol use: Yes  Alcohol/week: 10.0 standard drinks    Types: 10 Glasses of wine per week    Comment: wine 1 glass daily  . Drug use: No  . Sexual activity: Not Currently  Other Topics Concern  . Not on file  Social History Narrative   Lives alone, widow.  3 step children.     Social Determinants of Health   Financial Resource Strain:   . Difficulty of Paying Living Expenses: Not on file  Food Insecurity:   . Worried About Charity fundraiser in the Last Year: Not on file  . Ran Out of Food in the Last Year: Not on file  Transportation Needs:   . Lack of Transportation (Medical): Not on file  . Lack of Transportation (Non-Medical): Not on file  Physical Activity:   . Days of Exercise per Week: Not on file  . Minutes of Exercise per Session: Not on file  Stress:   . Feeling of Stress : Not on file  Social Connections:   . Frequency of Communication with Friends and Family: Not on file  . Frequency of Social Gatherings with Friends and Family: Not on file  . Attends Religious Services: Not on file  . Active Member of Clubs or Organizations: Not on file  . Attends Archivist Meetings: Not on file  . Marital Status: Not on file  Intimate Partner Violence:   . Fear of  Current or Ex-Partner: Not on file  . Emotionally Abused: Not on file  . Physically Abused: Not on file  . Sexually Abused: Not on file     Review of Systems    General:  No chills, fever, night sweats or weight changes.  Cardiovascular:  No chest pain, dyspnea on exertion, edema, orthopnea, palpitations, paroxysmal nocturnal dyspnea. Dermatological: No rash, lesions/masses Respiratory: No cough, dyspnea Urologic: No hematuria, dysuria Abdominal:   No nausea, vomiting, diarrhea, bright red blood per rectum, melena, or hematemesis Neurologic:  No visual changes, wkns, changes in mental status. All other systems reviewed and are otherwise negative except as noted above.  Physical Exam    VS:  BP 122/80   Pulse 69   Temp (!) 94.4 F (34.7 C)   Ht 5\' 4"  (1.626 m)   Wt 141 lb 9.6 oz (64.2 kg)   SpO2 95%   BMI 24.31 kg/m  , BMI Body mass index is 24.31 kg/m. GEN: Well nourished, well developed, in no acute distress. HEENT: normal. Neck: Supple, no JVD, carotid bruits, or masses. Cardiac: RRR, no murmurs, rubs, or gallops. No clubbing, cyanosis, edema.  Radials/DP/PT 2+ and equal bilaterally.  Respiratory:  Respirations regular and unlabored, clear to auscultation bilaterally. GI: Soft, nontender, nondistended, BS + x 4. MS: no deformity or atrophy. Skin: warm and dry, no rash. Neuro:  Strength and sensation are intact. Psych: Normal affect.  Accessory Clinical Findings    ECG personally reviewed by me today- none today.   EKG 05/08/19 Normal sinus rhythm 63 bpm  ETT 05/16/2019  There was no ST segment deviation noted during stress.  The patient walked for a total of 5 minutes and 13 seconds on a standard Bruce protocol treadmill test. She achieved a peak heart rate of 115 which is 82% predicted maximal heart rate.  The patient did not reach target heart rate but did not have any ST or T wave changes at her peak exercise level. She did develop frequent premature  ventricular contractions toward the end of her exercise test .  The blood pressure response to exercise was hypertensive.  This is interpreted as a nondiagnostic exercise test because of her inability to reach target heart rate.  She did not have any signs of ischemia at her maximal exercise level.   Assessment & Plan   1.  Dyspnea-she has had 2 episodes of shortness of breath lasting for 20 minutes over the last week.  These episodes were nonexertional and not anxiety induced.  Her ETT showed no ST segment deviation.  Peak heart rate of 115 bpm, 82% of predicted maximal heart rate.  No signs of ischemia noted during maximal exercise effort.  PFTs have been ordered urgently. Reviewed ETT  Presyncope-still reports episodes of presyncope with rising from laying or standing positions.  During last visit patient reported occasional episodes of dizziness.  Was suspected to be vagal in nature.  No recurrent episodes. Encouraged lower extremity support stockings Continue to monitor  Disposition: Follow-up with Dr. Percival Spanish as needed.  Jossie Ng. [facility] [place] [address] Office [phone] Fax [phone]

## 2019-06-06 ENCOUNTER — Encounter: Payer: Self-pay | Admitting: General Practice

## 2019-06-06 ENCOUNTER — Other Ambulatory Visit: Payer: Self-pay

## 2019-06-06 ENCOUNTER — Ambulatory Visit: Payer: Medicare Other | Admitting: General Practice

## 2019-06-06 VITALS — BP 122/80 | HR 69 | Temp 94.4°F | Ht 64.0 in | Wt 141.6 lb

## 2019-06-06 DIAGNOSIS — R0602 Shortness of breath: Secondary | ICD-10-CM

## 2019-06-06 DIAGNOSIS — R55 Syncope and collapse: Secondary | ICD-10-CM | POA: Diagnosis not present

## 2019-06-06 DIAGNOSIS — R42 Dizziness and giddiness: Secondary | ICD-10-CM

## 2019-06-06 NOTE — Patient Instructions (Signed)
Testing/Procedures: Your physician has recommended that you have a pulmonary function test. Pulmonary Function Tests are a group of tests that measure how well air moves in and out of your lungs. pt needs to have PFT-urgently more frequent resp difficulty while at rest lasting 20 min.  Special Instructions: PLEASE PURCHASE AND WEAR COMPRESSION STOCKINGS DAILY AND OFF AT BEDTIME. Compression stockings are elastic socks that squeeze the legs. They help to increase blood flow to the legs and to decrease swelling in the legs from fluid retention, and reduce the chance of developing blood clots in the lower legs.   PLEASE READ AND FOLLOW SALTY 6 ATTACHED  Reduce your risk of getting COVID-19 With your heart disease it is especially important for people at increased risk of severe illness from COVID-19, and those who live with them, to protect themselves from getting COVID-19. The best way to protect yourself and to help reduce the spread of the virus that causes COVID-19 is to: Marland Kitchen Limit your interactions with other people as much as possible. . Take precautions to prevent getting COVID-19 when you do interact with others. If you start feeling sick and think you may have COVID-19, get in touch with your healthcare provider within 24 hours.  Follow-Up: AS NEEDED  You may see Minus Breeding, MD Coletta Memos, NP-C or one of the following Advanced Practice Providers on your designated Care Team:  Rosaria Ferries, PA-C  Jory Sims, DNP, ANP  Cadence Kathlen Mody, NP.    At [facility], you and your health needs are our priority.  As part of our continuing mission to provide you with exceptional heart care, we have created designated Provider Care Teams.  These Care Teams include your primary Cardiologist (physician) and Advanced Practice Providers (APPs -  Physician Assistants and Nurse Practitioners) who all work together to provide you with the care you need, when you need it.  Thank you for choosing  [facility]!!

## 2019-06-17 ENCOUNTER — Other Ambulatory Visit (HOSPITAL_COMMUNITY)
Admission: RE | Admit: 2019-06-17 | Discharge: 2019-06-17 | Disposition: A | Discharge status: Home / Self Care | Payer: Medicare Other | Source: Ambulatory Visit | Attending: Cardiology | Admitting: Cardiology

## 2019-06-17 DIAGNOSIS — Z01812 Encounter for preprocedural laboratory examination: Secondary | ICD-10-CM | POA: Diagnosis not present

## 2019-06-17 DIAGNOSIS — Z20822 Contact with and (suspected) exposure to covid-19: Secondary | ICD-10-CM | POA: Insufficient documentation

## 2019-06-17 LAB — SARS CORONAVIRUS 2 (TAT 6-24 HRS): SARS Coronavirus 2: NEGATIVE

## 2019-06-21 ENCOUNTER — Other Ambulatory Visit: Payer: Self-pay

## 2019-06-21 ENCOUNTER — Ambulatory Visit (INDEPENDENT_AMBULATORY_CARE_PROVIDER_SITE_OTHER): Payer: Medicare Other | Admitting: Internal Medicine

## 2019-06-21 DIAGNOSIS — R0602 Shortness of breath: Secondary | ICD-10-CM | POA: Diagnosis not present

## 2019-06-21 LAB — PULMONARY FUNCTION TEST
DL/VA % pred: 120 %
DL/VA: 4.92 ml/min/mmHg/L
DLCO unc % pred: 98 %
DLCO unc: 18.57 ml/min/mmHg
FEF 25-75 Post: 0.99 L/sec
FEF 25-75 Pre: 0.64 L/sec
FEF2575-%Change-Post: 54 %
FEF2575-%Pred-Post: 70 %
FEF2575-%Pred-Pre: 45 %
FEV1-%Change-Post: 13 %
FEV1-%Pred-Post: 78 %
FEV1-%Pred-Pre: 69 %
FEV1-Post: 1.52 L
FEV1-Pre: 1.34 L
FEV1FVC-%Change-Post: 0 %
FEV1FVC-%Pred-Pre: 84 %
FEV6-%Change-Post: 14 %
FEV6-%Pred-Post: 97 %
FEV6-%Pred-Pre: 85 %
FEV6-Post: 2.41 L
FEV6-Pre: 2.11 L
FEV6FVC-%Change-Post: 0 %
FEV6FVC-%Pred-Post: 104 %
FEV6FVC-%Pred-Pre: 104 %
FVC-%Change-Post: 12 %
FVC-%Pred-Post: 93 %
FVC-%Pred-Pre: 82 %
FVC-Post: 2.43 L
FVC-Pre: 2.16 L
Post FEV1/FVC ratio: 63 %
Post FEV6/FVC ratio: 99 %
Pre FEV1/FVC ratio: 62 %
Pre FEV6/FVC Ratio: 99 %
RV % pred: 142 %
RV: 3.41 L
TLC % pred: 112 %
TLC: 5.68 L

## 2019-06-21 NOTE — Progress Notes (Signed)
Full PFT performed today. °

## 2019-07-03 DIAGNOSIS — Z85820 Personal history of malignant melanoma of skin: Secondary | ICD-10-CM | POA: Diagnosis not present

## 2019-07-03 DIAGNOSIS — L821 Other seborrheic keratosis: Secondary | ICD-10-CM | POA: Diagnosis not present

## 2019-07-07 DIAGNOSIS — R0609 Other forms of dyspnea: Secondary | ICD-10-CM | POA: Diagnosis not present

## 2019-07-10 ENCOUNTER — Other Ambulatory Visit: Payer: Self-pay

## 2019-07-10 ENCOUNTER — Encounter: Payer: Self-pay | Admitting: Pulmonary Disease

## 2019-07-10 ENCOUNTER — Ambulatory Visit: Payer: Medicare Other | Admitting: Pulmonary Disease

## 2019-07-10 VITALS — BP 104/62 | HR 97 | Temp 97.0°F | Ht 63.5 in | Wt 141.6 lb

## 2019-07-10 DIAGNOSIS — M20032 Swan-neck deformity of left finger(s): Secondary | ICD-10-CM

## 2019-07-10 DIAGNOSIS — Z87891 Personal history of nicotine dependence: Secondary | ICD-10-CM | POA: Diagnosis not present

## 2019-07-10 DIAGNOSIS — R0602 Shortness of breath: Secondary | ICD-10-CM

## 2019-07-10 DIAGNOSIS — M20031 Swan-neck deformity of right finger(s): Secondary | ICD-10-CM

## 2019-07-10 LAB — CBC WITH DIFFERENTIAL/PLATELET
Basophils Absolute: 0 10*3/uL (ref 0.0–0.1)
Basophils Relative: 0.2 % (ref 0.0–3.0)
Eosinophils Absolute: 0.1 10*3/uL (ref 0.0–0.7)
Eosinophils Relative: 1.7 % (ref 0.0–5.0)
HCT: 46.6 % — ABNORMAL HIGH (ref 36.0–46.0)
Hemoglobin: 15.6 g/dL — ABNORMAL HIGH (ref 12.0–15.0)
Lymphocytes Relative: 28.9 % (ref 12.0–46.0)
Lymphs Abs: 1.9 10*3/uL (ref 0.7–4.0)
MCHC: 33.5 g/dL (ref 30.0–36.0)
MCV: 92.6 fl (ref 78.0–100.0)
Monocytes Absolute: 0.6 10*3/uL (ref 0.1–1.0)
Monocytes Relative: 8.7 % (ref 3.0–12.0)
Neutro Abs: 4 10*3/uL (ref 1.4–7.7)
Neutrophils Relative %: 60.5 % (ref 43.0–77.0)
Platelets: 233 10*3/uL (ref 150.0–400.0)
RBC: 5.03 Mil/uL (ref 3.87–5.11)
RDW: 13 % (ref 11.5–15.5)
WBC: 6.6 10*3/uL (ref 4.0–10.5)

## 2019-07-10 MED ORDER — BREO ELLIPTA 100-25 MCG/INH IN AEPB: 1.0000 | INHALATION_SPRAY | Freq: Every day | RESPIRATORY_TRACT | 0 refills | Status: DC

## 2019-07-10 NOTE — Assessment & Plan Note (Addendum)
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

## 2019-07-10 NOTE — Patient Instructions (Addendum)
You were seen today by Lauraine Rinne, NP  for:   It was nice meeting you today.  Thank you for presenting to our office.  We will work to get you established with a pulmonologist in our office sometime over the next 4 weeks.  We will trial you on a new inhaler called Breo Ellipta 100.  Samples were provided for you today.  Please call us when you finish the first sample to let us know how you are doing or send [place] a MyChart message.  We will also get lab work on you today.   Please feel free to give [place] a call if you have additional questions or concerns.  Take care and stay safe,  Annita Ratliff  1. Shortness of breath  - ANA,IFA RA Diag Pnl w/rflx Tit/Patn; Future - Rheumatoid Factor; Future - Cyclic citrul peptide antibody, IgG; Future - CBC with Differential/Platelet; Future - IgE; Future  Walk today - YOU DID GREAT!  Trial of Breo Ellipta 100 >>> Take 1 puff daily in the morning right when you wake up >>>Rinse your mouth out after use >>>This is a daily maintenance inhaler, NOT a rescue inhaler >>>Contact our office if you are having difficulties affording or obtaining this medication >>>It is important for you to be able to take this daily and not miss any doses     We recommend today:  Orders Placed This Encounter  Procedures  . ANA,IFA RA Diag Pnl w/rflx Tit/Patn    Standing Status:   Future    Standing Expiration Date:   07/09/2020  . Rheumatoid Factor    Standing Status:   Future    Standing Expiration Date:   07/09/2020  . Cyclic citrul peptide antibody, IgG    Standing Status:   Future    Standing Expiration Date:   07/09/2020  . CBC with Differential/Platelet    Standing Status:   Future    Standing Expiration Date:   07/09/2020  . IgE    Standing Status:   Future    Standing Expiration Date:   07/09/2020   Orders Placed This Encounter  Procedures  . ANA,IFA RA Diag Pnl w/rflx Tit/Patn  . Rheumatoid Factor  . Cyclic citrul peptide antibody, IgG  . CBC with  Differential/Platelet  . IgE   Meds ordered this encounter  Medications  . fluticasone furoate-vilanterol (BREO ELLIPTA) 100-25 MCG/INH AEPB    Sig: Inhale 1 puff into the lungs daily.    Dispense:  1 each    Refill:  0    Order Specific Question:   Lot Number?    Answer:   [id number]    Order Specific Question:   Expiration Date?    Answer:   12/19/2020    Order Specific Question:   Manufacturer?    Answer:   [employer] [[id number]]    Order Specific Question:   Quantity    Answer:   2    Follow Up:    Return in about 4 weeks (around 08/07/2019), or if symptoms worsen or fail to improve, for NEW PULMONOLOGIST IN 24min SLOT.  Dr. Vaughan Browner or Dr. Shearon Stalls   Please do your part to reduce the spread of COVID-19:      Reduce your risk of any infection  and COVID19 by using the similar precautions used for avoiding the common cold or flu:  Marland Kitchen Wash your hands often with soap and warm water for at least 20 seconds.  If soap and water are not readily available,  use an alcohol-based hand sanitizer with at least 60% alcohol.  . If coughing or sneezing, cover your mouth and nose by coughing or sneezing into the elbow areas of your shirt or coat, into a tissue or into your sleeve (not your hands). Langley Gauss A MASK when in public  . Avoid shaking hands with others and consider head nods or verbal greetings only. . Avoid touching your eyes, nose, or mouth with unwashed hands.  . Avoid close contact with people who are sick. . Avoid places or events with large numbers of people in one location, like concerts or sporting events. . If you have some symptoms but not all symptoms, continue to monitor at home and seek medical attention if your symptoms worsen. . If you are having a medical emergency, call 911.   [place] / e-Visit: [email or web address]         [facility]: [facility]: [id number]                   [facility]: [id number]     It is flu season:   >>> Best ways to protect herself from the flu: Receive the yearly flu vaccine, practice good hand hygiene washing with soap and also using hand sanitizer when available, eat a nutritious meals, get adequate rest, hydrate appropriately   Please contact the office if your symptoms worsen or you have concerns that you are not improving.   Thank you for choosing [facility] for your healthcare, and for allowing [place] to partner with you on your healthcare journey. I am thankful to be able to provide care to you today.   Wyn Quaker FNP-C

## 2019-07-10 NOTE — Assessment & Plan Note (Signed)
Continue to not smoke 

## 2019-07-10 NOTE — Assessment & Plan Note (Signed)
Reviewed pulmonary function testing today with patient Clear chest x-ray in January/2021 Cardiology work-up in 2021 clear  Discussion: Patient with history of seasonal allergies, no history of childhood asthma, positive bronchodilator response on pulmonary function testing as well as air trapping.  Discussed with patient that we can trial her on a ICS/LABA inhaler as treatment for asthma.  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  We will also get connective tissue lab work.  Potential malar rash on exam patient's face.  Plan: Walk today in office Lab work today as ordered Trial of Breo Ellipta 100 4-week follow-up to establish care with a pulmonologist

## 2019-07-10 NOTE — Progress Notes (Signed)
@Patient  ID: Morgan Gregory, female    DOB: 12-12-1940, 79 y.o.   MRN: [id number]  Chief Complaint  Patient presents with  . Consult    Recent PFTs, Dyspnea     Referring provider: Kelton Pillar, MD  HPI:  79 year old female former smoker consulted with our practice today for further evaluation of dyspnea on exertion  PMH: Allergies Smoker/ Smoking History: Former smoker.  Quit 1966.  4.5 pack year smoking history.  Significant secondhand smoke exposure from her father as well as her late husband. Maintenance: None Pt of: ?  07/10/2019  - Visit   79 year old female former smoker presenting to office today as a consult for dyspnea on exertion.  Patient has obtain pulmonary function testing ordered by cardiology.  Those results are listed below:  06/21/2019-pulmonary function test-FVC 2.16 (82% predicted), postbronchodilator ratio 63, postbronchodilator FEV1 1.52 (78% predicted), positive bronchodilator response, mid flow reversibility, DLCO 18.57 (98% predicted)  Patient has been followed by cardiology last seen in February/2021 for evaluation of shortness of breath.  Patient with no significant past cardiac history.  Patient presenting to our office today as a consult for dyspnea.  She is actually scheduled with a nurse practitioner so she will need to be established with a pulmonologist after this visit.  Patient reporting that she is for started noticing that she had bouts of shortness of breath 25 years ago [place] and she would struggle with increased shortness of breath with physical activity as well as with higher altitudes.  Patient reports this came and went occasionally over the past couple of years but persisted and worsened in December/2020.  She feels this may be directly related to her scoliosis.  She does report wheezing with physical exertion especially when talking with physical exertion.  Patient denies  history of premature birth, childhood asthma, recurrent bronchitis or pneumonias.  She does have spring allergies.  She does feel that she has triggers to her breathing humidity as well as change of seasons.  Patient denies any connective tissue history.  Denies any family history of autoimmune diseases.  Patient has no family history of lung disease with the exception of her father who was a smoker and had emphysema.  Patient with a very small remote smoking history.  She quit in 1966.  4.5-pack-year smoking history.  Significant secondhand smoke exposure from her father growing up as well as her husband.  Husband passed away greater than 20 years ago.  Patient tolerated walk in office today without any oxygen desaturations.  Tests:   EKG 05/08/19 Normal sinus rhythm 63 bpm  ETT 05/16/2019  There was no ST segment deviation noted during stress.  The patient walked for a total of 5 minutes and 13 seconds on a standard Bruce protocol treadmill test. She achieved a peak heart rate of 115 which is 82% predicted maximal heart rate.  The patient did not reach target heart rate but did not have any ST or T wave changes at her peak exercise level. She did develop frequent premature ventricular contractions toward the end of her exercise test .  The blood pressure response to exercise was hypertensive.  This is interpreted as a nondiagnostic exercise test because of her inability to reach target heart rate.  She did not have any signs of ischemia at her maximal exercise level.  06/21/2019-pulmonary function test-FVC 2.16 (82% predicted), postbronchodilator ratio 63, postbronchodilator FEV1 1.52 (78% predicted), positive bronchodilator response,  mid flow reversibility, DLCO 18.57 (98% predicted)  04/25/2019-chest x-ray-no acute or active cardiopulmonary disease  05/05/2019-BNP-141.2   FENO:  No results found for: NITRICOXIDE  PFT: PFT Results Latest Ref Rng & Units 06/21/2019  FVC-Pre L 2.16   FVC-Predicted Pre % 82  FVC-Post L 2.43  FVC-Predicted Post % 93  Pre FEV1/FVC % % 62  Post FEV1/FCV % % 63  FEV1-Pre L 1.34  FEV1-Predicted Pre % 69  FEV1-Post L 1.52  DLCO UNC% % 98  DLCO COR %Predicted % 120  TLC L 5.68  TLC % Predicted % 112  RV % Predicted % 142    WALK:  No flowsheet data found.  Imaging: No results found.  Lab Results:  CBC    Component Value Date/Time   WBC 11.7 (H) 04/28/2017 0555   RBC 4.18 04/28/2017 0555   HGB 12.9 04/28/2017 0555   HCT 38.7 04/28/2017 0555   PLT 187 04/28/2017 0555   MCV 92.6 04/28/2017 0555   MCH 30.9 04/28/2017 0555   MCHC 33.3 04/28/2017 0555   RDW 12.2 04/28/2017 0555   LYMPHSABS 2.0 12/12/2009 1145   MONOABS 0.4 12/12/2009 1145   EOSABS 0.1 12/12/2009 1145   BASOSABS 0.0 12/12/2009 1145    BMET    Component Value Date/Time   NA 138 04/28/2017 0555   K 4.2 04/28/2017 0555   CL 105 04/28/2017 0555   CO2 28 04/28/2017 0555   GLUCOSE 144 (H) 04/28/2017 0555   BUN 15 04/28/2017 0555   CREATININE 0.68 04/28/2017 0555   CALCIUM 8.3 (L) 04/28/2017 0555   GFRNONAA >60 04/28/2017 0555   GFRAA >60 04/28/2017 0555    BNP    Component Value Date/Time   BNP 141.2 (H) 05/05/2019 1453    ProBNP No results found for: PROBNP  Specialty Problems      Pulmonary Problems   Shortness of breath      Allergies  Allergen Reactions  . Augmentin [Amoxicillin-Pot Clavulanate] Diarrhea and Nausea Only    Has patient had a PCN reaction causing immediate rash, facial/tongue/throat swelling, SOB or lightheadedness with hypotension: No Has patient had a PCN reaction causing severe rash involving mucus membranes or skin necrosis: No Has patient had a PCN reaction that required hospitalization No Has patient had a PCN reaction occurring within the last 10 years: No If all of the above answers are "NO", then may proceed with Cephalosporin use.   . Erythromycin Diarrhea and Nausea Only    Severe diarrhea     Immunization History  Administered Date(s) Administered  . Influenza, High Dose Seasonal PF 02/13/2019  . Moderna SARS-COVID-2 Vaccination 05/02/2019, 06/27/2019  . Pneumococcal Conjugate-13 01/09/2014  . Zoster Recombinat (Shingrix) 06/29/2017    Past Medical History:  Diagnosis Date  . Arthritis   . Cancer ([place]) 1968   melanoma on right shin  . Colon polyps   . DDD (degenerative disc disease), cervical 03/29/2009   DG Cervical Spine Complete  . Diverticulosis of sigmoid colon 01/2017  . Edema    right lower leg/foot due to s/p melanoma excision  . Gastritis   . GERD (gastroesophageal reflux disease)    03-05-15 not an issue now  . History of kidney stones   . Hypothyroidism   . IBS (irritable bowel syndrome)   . Spondylosis 03/29/2009   DG Cervical Spine Complete  . Thyroid disease    hypothyroid    Tobacco History: Social History   Tobacco Use  Smoking Status Former Smoker  .  Packs/day: 0.75  . Years: 6.00  . Pack years: 4.50  . Types: Cigarettes  . Start date: 15  . Quit date: 03/04/1965  . Years since quitting: 54.3  Smokeless Tobacco Never Used   Counseling given: Yes   Continue to not smoke  Outpatient Encounter Medications as of 07/10/2019  Medication Sig  . acetaminophen (TYLENOL) 500 MG tablet Take 2 tablets (1,000 mg total) by mouth every 6 (six) hours. (Patient taking differently: Take 1,000 mg by mouth every 6 (six) hours as needed. )  . cholecalciferol (VITAMIN D) 1000 UNITS tablet Take 1,000 Units by mouth daily.  Marland Kitchen levothyroxine (SYNTHROID, LEVOTHROID) 50 MCG tablet Take 50 mcg by mouth daily.   Vladimir Faster Glycol-Propyl Glycol (SYSTANE OP) Apply 1 drop to eye 2 (two) times daily as needed (dry eyes).  Marland Kitchen zolpidem (AMBIEN) 10 MG tablet Take 5 mg by mouth at bedtime.   . fluticasone furoate-vilanterol (BREO ELLIPTA) 100-25 MCG/INH AEPB Inhale 1 puff into the lungs daily.  . [DISCONTINUED] docusate sodium (COLACE) 100 MG capsule Take 1 capsule  (100 mg total) by mouth 2 (two) times daily.  . [DISCONTINUED] polyethylene glycol (MIRALAX / GLYCOLAX) packet Take 17 g by mouth 2 (two) times daily. (Patient taking differently: Take 17 g by mouth daily as needed. )   No facility-administered encounter medications on file as of 07/10/2019.     Review of Systems  Review of Systems  Constitutional: Positive for fatigue. Negative for activity change and fever.  HENT: Negative for sinus pressure, sinus pain and sore throat.   Respiratory: Positive for shortness of breath and wheezing. Negative for cough.   Cardiovascular: Negative for chest pain and palpitations.  Gastrointestinal: Negative for diarrhea, nausea and vomiting.  Musculoskeletal: Negative for arthralgias.  Neurological: Negative for dizziness.  Psychiatric/Behavioral: Negative for sleep disturbance. The patient is not nervous/anxious.      Physical Exam  BP 104/62 (BP Location: Left Arm, Cuff Size: Normal)   Pulse 97   Temp (!) 97 F (36.1 C) (Temporal)   Ht 5' 3.5" (1.613 m)   Wt 141 lb 9.6 oz (64.2 kg)   SpO2 96%   BMI 24.69 kg/m   Wt Readings from Last 5 Encounters:  07/10/19 141 lb 9.6 oz (64.2 kg)  06/06/19 141 lb 9.6 oz (64.2 kg)  05/05/19 141 lb (64 kg)  04/27/17 140 lb (63.5 kg)  04/22/17 140 lb (63.5 kg)    BMI Readings from Last 5 Encounters:  07/10/19 24.69 kg/m  06/06/19 24.31 kg/m  05/05/19 24.78 kg/m  04/27/17 24.80 kg/m  04/22/17 24.80 kg/m     Physical Exam Vitals and nursing note reviewed.  Constitutional:      General: She is not in acute distress.    Appearance: Normal appearance. She is normal weight.  HENT:     Head: Normocephalic and atraumatic.     Right Ear: Tympanic membrane, ear canal and external ear normal. There is no impacted cerumen.     Left Ear: Tympanic membrane, ear canal and external ear normal. There is no impacted cerumen.     Nose: Nose normal. No congestion or rhinorrhea.     Mouth/Throat:     Mouth:  Mucous membranes are moist.     Pharynx: Oropharynx is clear.  Eyes:     Pupils: Pupils are equal, round, and reactive to light.  Cardiovascular:     Rate and Rhythm: Normal rate and regular rhythm.     Pulses: Normal pulses.  Heart sounds: Normal heart sounds. No murmur.  Pulmonary:     Effort: Pulmonary effort is normal. No respiratory distress.     Breath sounds: No decreased air movement. No decreased breath sounds, wheezing or rales.  Abdominal:     General: Abdomen is flat. Bowel sounds are normal.     Palpations: Abdomen is soft.  Musculoskeletal:     Right hand: Deformity (Swan-neck) present.     Left hand: Deformity (Swan-neck) present.     Cervical back: Normal range of motion.  Skin:    General: Skin is warm and dry.     Capillary Refill: Capillary refill takes less than 2 seconds.       Neurological:     General: No focal deficit present.     Mental Status: She is alert and oriented to person, place, and time. Mental status is at baseline.     Gait: Gait (Tolerated walk in office today with no oxygen desaturations) normal.  Psychiatric:        Mood and Affect: Mood normal.        Behavior: Behavior normal.        Thought Content: Thought content normal.        Judgment: Judgment normal.       Assessment & Plan:   Shortness of breath Reviewed pulmonary function testing today with patient Clear chest x-ray in January/2021 Cardiology work-up in 2021 clear  Discussion: Patient with history of seasonal allergies, no history of childhood asthma, positive bronchodilator response on pulmonary function testing as well as air trapping.  Discussed with patient that we can trial her on a ICS/LABA inhaler as treatment for asthma.  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  We will also get connective tissue lab work.  Potential malar rash on exam patient's face.  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   Lauraine Rinne, NP 07/10/2019   This appointment required 48 minutes of patient care (this includes precharting, chart review, review of results, face-to-face care, etc.).

## 2019-07-10 NOTE — Progress Notes (Signed)
Patient seen in the office today and instructed on use of ellipta.  Patient expressed understanding and demonstrated technique.  

## 2019-07-12 LAB — ANA,IFA RA DIAG PNL W/RFLX TIT/PATN
Anti Nuclear Antibody (ANA): NEGATIVE
Cyclic Citrullin Peptide Ab: <16 UNITS
Rhuematoid fact SerPl-aCnc: <14 IU/mL (ref <14)

## 2019-07-12 LAB — IGE: IgE (Immunoglobulin E), Serum: 45 kU/L (ref <=114)

## 2019-07-13 ENCOUNTER — Telehealth: Payer: Self-pay | Admitting: Internal Medicine

## 2019-07-13 NOTE — Telephone Encounter (Signed)
LMTCB x 2  Lauraine Rinne, NP  Valerie Salts, CMA  Allergy markers are normal. We are still waiting on the rest of your connective tissue lab work to come back.  No further recommendations or changes.  Keep follow-up with our office.  How is she doing on the inhaler?  Wyn Quaker, FNP

## 2019-07-13 NOTE — Telephone Encounter (Signed)
Pt returning call.  Will be available until 2:30.  May leave message on voice mail.  [phone]

## 2019-07-13 NOTE — Telephone Encounter (Signed)
Called pt and there was no answer-LMTCB °

## 2019-07-14 NOTE — Telephone Encounter (Signed)
Spoke with pt. She is aware of her results. Nothing further was needed. 

## 2019-07-19 ENCOUNTER — Telehealth: Payer: Self-pay | Admitting: Internal Medicine

## 2019-07-19 MED ORDER — BREO ELLIPTA 100-25 MCG/INH IN AEPB: 1.0000 | INHALATION_SPRAY | Freq: Every day | RESPIRATORY_TRACT | 3 refills | Status: DC

## 2019-07-19 NOTE — Telephone Encounter (Signed)
Called and spoke with patient. Caught her up on all her results on her lab work. Patient states that she is going good with inhaler and has noticed a difference. Patient stated that she has samples of Breo and would like a prescription sent to [facility]. Prescription sent in. Patient will keep follow up appt with Dr. Shearon Stalls in April.

## 2019-08-14 ENCOUNTER — Other Ambulatory Visit: Payer: Self-pay

## 2019-08-14 ENCOUNTER — Ambulatory Visit: Payer: Medicare Other | Admitting: Internal Medicine

## 2019-08-14 ENCOUNTER — Encounter: Payer: Self-pay | Admitting: Internal Medicine

## 2019-08-14 VITALS — BP 113/70 | HR 66 | Temp 98.1°F | Ht 63.5 in | Wt 131.8 lb

## 2019-08-14 DIAGNOSIS — J301 Allergic rhinitis due to pollen: Secondary | ICD-10-CM | POA: Diagnosis not present

## 2019-08-14 DIAGNOSIS — J4530 Mild persistent asthma, uncomplicated: Secondary | ICD-10-CM | POA: Diagnosis not present

## 2019-08-14 MED ORDER — LEVALBUTEROL TARTRATE 45 MCG/ACT IN AERO: 2.0000 | INHALATION_SPRAY | RESPIRATORY_TRACT | 12 refills | Status: DC | PRN

## 2019-08-14 NOTE — Progress Notes (Signed)
Morgan Gregory    [id number]    1941/04/10  Primary Care Physician:Griffin, Margaretha Sheffield, MD  Referring Physician: Kelton Pillar, MD [place] [address] [employer] [address] [place],  [place] [address] Reason for Consultation: shortness of breath Date of Consultation: 08/14/2019  Chief complaint:   Chief Complaint  Patient presents with  . Consult    sob.  walking uphill.  Going on for 6-8 months.  Has gotten progressively worse.     HPI: Morgan Gregory is a 79 y.o. woman who presents for new patient evaluation for dyspnea on exertion.  She was seen by Wyn Quaker NP initially and is now establishing with a physician. Started on Breo but can't tell if it's making much of a difference although she later notes that she no longer has feelings of anxiety and panic when walking uphill and this might of gotten better with the Breo.  She only had albuterol once but it caused such severe tachycardia and anxiety that she was unable to tolerate it has not taken it.  Overall she feels like it is difficult to exhale completely empty her lungs, rather than take a deep breath in.  She has no cough.  She has seasonal allergies as well as allergies to down pillows.  Taking cetirizine for this as needed seasonally.  Social history:  Occupation: Pharmacist, hospital in [employer].  Exposures: lives independently.  Smoking history: former very remote smoker. Significant passive smoke exposure in childhood and husband.   Social History   Occupational History  . Not on file  Tobacco Use  . Smoking status: Former Smoker    Packs/day: 0.75    Years: 6.00    Pack years: 4.50    Types: Cigarettes    Start date: 71    Quit date: 03/04/1965    Years since quitting: 54.4  . Smokeless tobacco: Never Used  Substance and Sexual Activity  . Alcohol use: Yes    Alcohol/week: 10.0 standard drinks    Types: 10 Glasses of wine per week    Comment: wine 1 glass daily  . Drug use: No  . Sexual  activity: Not Currently    Relevant family history:  Family History  Problem Relation Age of Onset  . Colon cancer Mother 30  . Emphysema Father   . Heart attack Brother 77  . Esophageal cancer Neg Hx   . Rectal cancer Neg Hx   . Stomach cancer Neg Hx     Past Medical History:  Diagnosis Date  . Arthritis   . Cancer ([place]) 1968   melanoma on right shin  . Colon polyps   . DDD (degenerative disc disease), cervical 03/29/2009   DG Cervical Spine Complete  . Diverticulosis of sigmoid colon 01/2017  . Edema    right lower leg/foot due to s/p melanoma excision  . Gastritis   . GERD (gastroesophageal reflux disease)    03-05-15 not an issue now  . History of kidney stones   . Hypothyroidism   . IBS (irritable bowel syndrome)   . Spondylosis 03/29/2009   DG Cervical Spine Complete  . Thyroid disease    hypothyroid    Past Surgical History:  Procedure Laterality Date  . ABDOMINAL HYSTERECTOMY  1979  . CATARACT EXTRACTION, BILATERAL     8'15,. 9'15  . CERVICAL SPINE SURGERY  2007   posterior approach-no retained hardware  . COLONOSCOPY    . FOOT SURGERY Bilateral    hammertoe, neuromas,  bunionectomy  . MELANOMA EXCISION  1968   right shin/calf- calf measures 0.75 inches larger than left  . THYROIDECTOMY, PARTIAL  2011  . TONSILLECTOMY    . 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 TOTAL KNEE ARTHROPLASTY Right 04/27/2017   Procedure: RIGHT TOTAL KNEE ARTHROPLASTY;  Surgeon: Paralee Cancel, MD;  Location: [facility];  Service: Orthopedics;  Laterality: Right;  70 mins     Review of systems: Review of Systems  Constitutional: Negative for chills, fever and weight loss.  HENT: Negative for congestion, sinus pain and sore throat.   Eyes: Negative for discharge and redness.  Respiratory: Negative for cough, hemoptysis, sputum production,  shortness of breath and wheezing.   Cardiovascular: Negative for chest pain, palpitations and leg swelling.  Gastrointestinal: Negative for heartburn, nausea and vomiting.  Musculoskeletal: Negative for joint pain and myalgias.  Skin: Negative for rash.  Neurological: Negative for dizziness, tremors, focal weakness and headaches.  Endo/Heme/Allergies: Negative for environmental allergies.  Psychiatric/Behavioral: Negative for depression. The patient is not nervous/anxious.   All other systems reviewed and are negative.   Physical Exam: Blood pressure 113/70, pulse 66, temperature 98.1 F (36.7 C), temperature source Temporal, height 5' 3.5" (1.613 m), weight 131 lb 12.8 oz (59.8 kg), SpO2 98 %. Gen:      No acute distress ENT:  no nasal polyps, mucus membranes moist Lungs:    No increased respiratory effort, symmetric chest wall excursion, clear to auscultation bilaterally, no wheezes or crackles CV:         Regular rate and rhythm; no murmurs, rubs, or gallops.  No pedal edema Abd:      + bowel sounds; soft, non-tender; no distension MSK: no acute synovitis. She has bilateral heberden's nodules.  Skin:      Warm and dry; no rashes Neuro: normal speech, no focal facial asymmetry Psych: alert and oriented x3, normal mood and affect   Data Reviewed/Medical Decision Making:  Independent interpretation of tests: Imaging: . Review of patient's chest xray images January 2021 revealed severe right-sided scoliosis as well as mild flattening of the diaphragms and hyperinflation. The patient's images have been independently reviewed by me.    PFTs: I have personally reviewed the patient's PFTs and they are consistent with mild airflow limitation with positive bronchodilator response.  The RV/TLC ratio is elevated consistent with air trapping. PFT Results Latest Ref Rng & Units 06/21/2019  FVC-Pre L 2.16  FVC-Predicted Pre % 82  FVC-Post L 2.43  FVC-Predicted Post % 93  Pre FEV1/FVC % % 62    Post FEV1/FCV % % 63  FEV1-Pre L 1.34  FEV1-Predicted Pre % 69  FEV1-Post L 1.52  DLCO UNC% % 98  DLCO COR %Predicted % 120  TLC L 5.68  TLC % Predicted % 112  RV % Predicted % 142    Labs: ANA negative  Immunization status:  Immunization History  Administered Date(s) Administered  . Influenza, High Dose Seasonal PF 02/13/2019  . Moderna SARS-COVID-2 Vaccination 05/02/2019, 06/27/2019  . Pneumococcal Conjugate-13 01/09/2014  . Zoster Recombinat (Shingrix) 06/29/2017    . I reviewed prior external note(s) from Wyn Quaker.  . I reviewed the result(s) of the labs and imaging as noted above.   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  Allergic rhinitis  Plan/Recommendations: Continue breo Start levalbuterol, she was unable to tolerate albuterol due to tachycardia. Agree with her advancing her exercise regimen with regular walking and weight training as tolerated. No evidence of interstitial lung disease on exam or imaging. We discussed disease management and progression at length today.  I will see her back in a few months to follow-up to see how she is doing with Xopenex and Breo. Can continue as needed antihistamine for seasonal rhinitis  Return to Care: Return in about 4 months (around 12/14/2019).  Lenice Llamas, MD Pulmonary and [place]  CC: Kelton Pillar, MD

## 2019-08-14 NOTE — Patient Instructions (Signed)
The patient should have follow up scheduled with myself in 4 months.   Take the levalbuterol rescue inhaler every 4 to 6 hours as needed for wheezing or shortness of breath. You can also take it 15 minutes before exercise or exertional activity. Side effects include heart racing or pounding, jitters or anxiety. If you have a history of an irregular heart rhythm, it can make this worse. Can also give some patients a hard time sleeping.  To inhale the aerosol using an inhaler, follow these steps:  1. Remove the protective dust cap from the end of the mouthpiece. If the dust cap was not placed on the mouthpiece, check the mouthpiece for dirt or other objects. Be sure that the canister is fully and firmly inserted in the mouthpiece. 2. If you are using the inhaler for the first time or if you have not used the inhaler in more than 14 days, you will need to prime it. You may also need to prime the inhaler if it has been dropped. Ask your pharmacist or check the manufacturer's information if this happens. To prime the inhaler, shake it well and then press down on the canister 4 times to release 4 sprays into the air, away from your face. Be careful not to get albuterol in your eyes. 3. Shake the inhaler well. 4. Breathe out as completely as possible through your mouth. 4. Hold the canister with the mouthpiece on the bottom, facing you and the canister pointing upward. Place the open end of the mouthpiece into your mouth. Close your lips tightly around the mouthpiece. 6. Breathe in slowly and deeply through the mouthpiece.At the same time, press down once on the container to spray the medication into your mouth. 7. Try to hold your breath for 10 seconds. remove the inhaler, and breathe out slowly. 8. If you were told to use 2 puffs, wait 1 minute and then repeat steps 3-7. 9. Replace the protective cap on the inhaler. 10. Clean your inhaler regularly. Follow the manufacturer's directions carefully and ask  your doctor or pharmacist if you have any questions about cleaning your inhaler.  Check the back of the inhaler to keep track of the total number of doses left on the inhaler.

## 2019-08-31 DIAGNOSIS — R319 Hematuria, unspecified: Secondary | ICD-10-CM | POA: Diagnosis not present

## 2019-09-04 DIAGNOSIS — R42 Dizziness and giddiness: Secondary | ICD-10-CM | POA: Insufficient documentation

## 2019-09-04 DIAGNOSIS — Z7189 Other specified counseling: Secondary | ICD-10-CM | POA: Insufficient documentation

## 2019-09-04 DIAGNOSIS — R55 Syncope and collapse: Secondary | ICD-10-CM | POA: Insufficient documentation

## 2019-09-04 NOTE — Progress Notes (Signed)
Cardiology Office Note   Date:  09/05/2019   ID:  Morgan Gregory, Morgan Gregory [birth year], MRN [id number]  PCP:  Kelton Pillar, MD  Cardiologist:   Minus Breeding, MD   No chief complaint on file.     History of Present Illness: Morgan Gregory is a 79 y.o. female who presents for follow-up of SOB.  She had a non diagnostic test as she did not get to her target heart rate.  She did not have any evidence of ischemia.   PFTs demonstrated a mild abnormality on PFTs with "minimal airway obstruction.  It was suggested that she could consider pulmonary follow up if she has increased dyspnea. However it was suggest that she did not need further cardiac testing .  She thinks that she has felt better on Breo.  She has been doing some walking.  She will start transitioning to the stationary bike.  She thinks with all of this her breathing has been better.  She denies any chest pressure, neck or arm discomfort.  She had no new shortness of breath, PND or orthopnea.  Has had no weight gain or edema.   Past Medical History:  Diagnosis Date  . Arthritis   . Cancer ([place]) 1968   melanoma on right shin  . Colon polyps   . DDD (degenerative disc disease), cervical 03/29/2009   DG Cervical Spine Complete  . Diverticulosis of sigmoid colon 01/2017  . Edema    right lower leg/foot due to s/p melanoma excision  . Gastritis   . GERD (gastroesophageal reflux disease)    03-05-15 not an issue now  . History of kidney stones   . Hypothyroidism   . IBS (irritable bowel syndrome)   . Spondylosis 03/29/2009   DG Cervical Spine Complete  . Thyroid disease    hypothyroid    Past Surgical History:  Procedure Laterality Date  . ABDOMINAL HYSTERECTOMY  1979  . CATARACT EXTRACTION, BILATERAL     8'15,. 9'15  . CERVICAL SPINE SURGERY  2007   posterior approach-no retained hardware  . COLONOSCOPY    . FOOT SURGERY Bilateral    hammertoe, neuromas, bunionectomy  . MELANOMA EXCISION   1968   right shin/calf- calf measures 0.75 inches larger than left  . THYROIDECTOMY, PARTIAL  2011  . TONSILLECTOMY    . 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 TOTAL KNEE ARTHROPLASTY Right 04/27/2017   Procedure: RIGHT TOTAL KNEE ARTHROPLASTY;  Surgeon: Paralee Cancel, MD;  Location: [facility];  Service: Orthopedics;  Laterality: Right;  70 mins     Current Outpatient Medications  Medication Sig Dispense Refill  . acetaminophen (TYLENOL) 500 MG tablet Take 2 tablets (1,000 mg total) by mouth every 6 (six) hours. (Patient taking differently: Take 1,000 mg by mouth every 6 (six) hours as needed. ) 30 tablet 0  . cholecalciferol (VITAMIN D) 1000 UNITS tablet Take 1,000 Units by mouth daily.    . ciprofloxacin (CIPRO) 500 MG tablet SMARTSIG:1 Tablet(s) By Mouth Every 12 Hours    . fluticasone furoate-vilanterol (BREO ELLIPTA) 100-25 MCG/INH AEPB Inhale 1 puff into the lungs daily. 1 each 0  . fluticasone furoate-vilanterol (BREO ELLIPTA) 100-25 MCG/INH AEPB Inhale 1 puff into the lungs daily. 60 each 3  . levalbuterol (XOPENEX HFA) 45 MCG/ACT inhaler Inhale 2 puffs into the lungs every 4 (four) hours as needed  for wheezing or shortness of breath. 1 Inhaler 12  . levothyroxine (SYNTHROID, LEVOTHROID) 50 MCG tablet Take 50 mcg by mouth daily.     Marland Kitchen MYRBETRIQ 25 MG TB24 tablet Take 25 mg by mouth daily.    Marland Kitchen OVER THE COUNTER MEDICATION Take 10 mg by mouth as needed.    Vladimir Faster Glycol-Propyl Glycol (SYSTANE OP) Apply 1 drop to eye 2 (two) times daily as needed (dry eyes).    Marland Kitchen zolpidem (AMBIEN) 10 MG tablet Take 5 mg by mouth at bedtime.      No current facility-administered medications for this visit.    Allergies:   Augmentin [amoxicillin-pot clavulanate] and Erythromycin    ROS:  Please see the history of present illness.   Otherwise, review of systems are  positive for none.   All other systems are reviewed and negative.    PHYSICAL EXAM: VS:  BP 129/76   Pulse 65   Temp (!) 97.4 F (36.3 C)   Resp 20   Ht 5' 3.5" (1.613 m)   Wt 143 lb 12.8 oz (65.2 kg)   SpO2 98%   BMI 25.07 kg/m  , BMI Body mass index is 25.07 kg/m. GENERAL:  Well appearing NECK:  No jugular venous distention, waveform within normal limits, carotid upstroke brisk and symmetric, no bruits, no thyromegaly LUNGS:  Clear to auscultation bilaterally CHEST:  Unremarkable HEART:  PMI not displaced or sustained,S1 and S2 within normal limits, no S3, no S4, no clicks, no rubs, no murmurs ABD:  Flat, positive bowel sounds normal in frequency in pitch, no bruits, no rebound, no guarding, no midline pulsatile mass, no hepatomegaly, no splenomegaly EXT:  2 plus pulses throughout, no edema, no cyanosis no clubbing   EKG:  EKG is not ordered today.   Recent Labs: 05/05/2019: BNP 141.2 07/10/2019: Hemoglobin 15.6; Platelets 233.0    Lipid Panel No results found for: CHOL, TRIG, HDL, CHOLHDL, VLDL, LDLCALC, LDLDIRECT    Wt Readings from Last 3 Encounters:  09/05/19 143 lb 12.8 oz (65.2 kg)  08/14/19 131 lb 12.8 oz (59.8 kg)  07/10/19 141 lb 9.6 oz (64.2 kg)      Other studies Reviewed: Additional studies/ records that were reviewed today include: Pulmonary notes. Review of the above records demonstrates:  Please see elsewhere in the note.     ASSESSMENT AND PLAN:  Dyspnea-    The patient thinks she got better on Breo.  It was suggested that she could try another bronchodilator but this was not approved and she did not pick up the prescription and she is not interested in that now as she has had some improvement.  I do not see a cardiac etiology.  No further testing is indicated.    Presyncope-    She had no further presyncope.  No change in therapy.  Covid education: The patient has had her vaccines   Current medicines are reviewed at length with the patient  today.  The patient does not have concerns regarding medicines.  The following changes have been made:  no change  Labs/ tests ordered today include:  No orders of the defined types were placed in this encounter.    Disposition:   FU with me as needed    Signed, Minus Breeding, MD  09/05/2019 1:50 PM    [facility]

## 2019-09-05 ENCOUNTER — Ambulatory Visit: Payer: Medicare Other | Admitting: Cardiology

## 2019-09-05 ENCOUNTER — Other Ambulatory Visit: Payer: Self-pay

## 2019-09-05 ENCOUNTER — Encounter: Payer: Self-pay | Admitting: Cardiology

## 2019-09-05 VITALS — BP 129/76 | HR 65 | Temp 97.4°F | Resp 20 | Ht 63.5 in | Wt 143.8 lb

## 2019-09-05 DIAGNOSIS — Z7189 Other specified counseling: Secondary | ICD-10-CM

## 2019-09-05 DIAGNOSIS — R0602 Shortness of breath: Secondary | ICD-10-CM

## 2019-09-05 DIAGNOSIS — R55 Syncope and collapse: Secondary | ICD-10-CM

## 2019-09-05 DIAGNOSIS — R42 Dizziness and giddiness: Secondary | ICD-10-CM

## 2019-09-05 NOTE — Patient Instructions (Signed)
Medication Instructions:  NO CHANGES *If you need a refill on your cardiac medications before your next appointment, please call your pharmacy*  Lab Work: NONE NEEDED THIS VISIT  Testing/Procedures: NONE NEEDED THIS VISIT  Follow-Up: At [facility], you and your health needs are our priority.  As part of our continuing mission to provide you with exceptional heart care, we have created designated Provider Care Teams.  These Care Teams include your primary Cardiologist (physician) and Advanced Practice Providers (APPs -  Physician Assistants and Nurse Practitioners) who all work together to provide you with the care you need, when you need it.  Your next appointment:   FOLLOW UP AS NEEDED

## 2019-09-14 DIAGNOSIS — M79673 Pain in unspecified foot: Secondary | ICD-10-CM | POA: Diagnosis not present

## 2019-09-14 DIAGNOSIS — R3915 Urgency of urination: Secondary | ICD-10-CM | POA: Diagnosis not present

## 2019-10-27 DIAGNOSIS — M6740 Ganglion, unspecified site: Secondary | ICD-10-CM | POA: Diagnosis not present

## 2019-10-27 DIAGNOSIS — M79672 Pain in left foot: Secondary | ICD-10-CM | POA: Insufficient documentation

## 2019-11-15 ENCOUNTER — Other Ambulatory Visit: Payer: Self-pay | Admitting: Family Medicine

## 2019-11-15 DIAGNOSIS — Z1231 Encounter for screening mammogram for malignant neoplasm of breast: Secondary | ICD-10-CM

## 2019-11-20 ENCOUNTER — Other Ambulatory Visit: Payer: Self-pay | Admitting: Orthopedic Surgery

## 2019-11-20 DIAGNOSIS — M67472 Ganglion, left ankle and foot: Secondary | ICD-10-CM | POA: Diagnosis not present

## 2019-11-21 DIAGNOSIS — M67472 Ganglion, left ankle and foot: Secondary | ICD-10-CM | POA: Diagnosis not present

## 2019-11-21 DIAGNOSIS — M19072 Primary osteoarthritis, left ankle and foot: Secondary | ICD-10-CM | POA: Diagnosis not present

## 2019-11-30 ENCOUNTER — Ambulatory Visit: Payer: Medicare Other

## 2019-12-08 ENCOUNTER — Ambulatory Visit
Admission: RE | Admit: 2019-12-08 | Discharge: 2019-12-08 | Disposition: A | Discharge status: Home / Self Care | Payer: Medicare Other | Source: Ambulatory Visit | Attending: Family Medicine | Admitting: Family Medicine

## 2019-12-08 ENCOUNTER — Other Ambulatory Visit: Payer: Self-pay

## 2019-12-08 DIAGNOSIS — Z1231 Encounter for screening mammogram for malignant neoplasm of breast: Secondary | ICD-10-CM | POA: Diagnosis not present

## 2020-01-04 DIAGNOSIS — Z4889 Encounter for other specified surgical aftercare: Secondary | ICD-10-CM | POA: Diagnosis not present

## 2020-01-04 DIAGNOSIS — M79672 Pain in left foot: Secondary | ICD-10-CM | POA: Diagnosis not present

## 2020-01-04 DIAGNOSIS — M6740 Ganglion, unspecified site: Secondary | ICD-10-CM | POA: Diagnosis not present

## 2020-01-09 DIAGNOSIS — Z85820 Personal history of malignant melanoma of skin: Secondary | ICD-10-CM | POA: Diagnosis not present

## 2020-01-09 DIAGNOSIS — L814 Other melanin hyperpigmentation: Secondary | ICD-10-CM | POA: Diagnosis not present

## 2020-01-09 DIAGNOSIS — L821 Other seborrheic keratosis: Secondary | ICD-10-CM | POA: Diagnosis not present

## 2020-01-09 DIAGNOSIS — D2272 Melanocytic nevi of left lower limb, including hip: Secondary | ICD-10-CM | POA: Diagnosis not present

## 2020-02-05 DIAGNOSIS — M79672 Pain in left foot: Secondary | ICD-10-CM | POA: Diagnosis not present

## 2020-02-05 DIAGNOSIS — M2042 Other hammer toe(s) (acquired), left foot: Secondary | ICD-10-CM | POA: Diagnosis not present

## 2020-02-05 DIAGNOSIS — T8484XA Pain due to internal orthopedic prosthetic devices, implants and grafts, initial encounter: Secondary | ICD-10-CM | POA: Diagnosis not present

## 2020-02-05 DIAGNOSIS — M79671 Pain in right foot: Secondary | ICD-10-CM | POA: Diagnosis not present

## 2020-02-05 DIAGNOSIS — T849XXA Unspecified complication of internal orthopedic prosthetic device, implant and graft, initial encounter: Secondary | ICD-10-CM | POA: Insufficient documentation

## 2020-02-20 DIAGNOSIS — G8918 Other acute postprocedural pain: Secondary | ICD-10-CM | POA: Diagnosis not present

## 2020-02-20 DIAGNOSIS — M2042 Other hammer toe(s) (acquired), left foot: Secondary | ICD-10-CM | POA: Diagnosis not present

## 2020-02-20 DIAGNOSIS — T8484XA Pain due to internal orthopedic prosthetic devices, implants and grafts, initial encounter: Secondary | ICD-10-CM | POA: Diagnosis not present

## 2020-02-20 DIAGNOSIS — M21622 Bunionette of left foot: Secondary | ICD-10-CM | POA: Diagnosis not present

## 2020-02-20 DIAGNOSIS — M2012 Hallux valgus (acquired), left foot: Secondary | ICD-10-CM | POA: Diagnosis not present

## 2020-03-06 DIAGNOSIS — M2040 Other hammer toe(s) (acquired), unspecified foot: Secondary | ICD-10-CM | POA: Insufficient documentation

## 2020-04-03 DIAGNOSIS — M79672 Pain in left foot: Secondary | ICD-10-CM | POA: Diagnosis not present

## 2020-04-03 DIAGNOSIS — M25531 Pain in right wrist: Secondary | ICD-10-CM | POA: Diagnosis not present

## 2020-04-03 DIAGNOSIS — M2042 Other hammer toe(s) (acquired), left foot: Secondary | ICD-10-CM | POA: Diagnosis not present

## 2020-04-03 DIAGNOSIS — Z4889 Encounter for other specified surgical aftercare: Secondary | ICD-10-CM | POA: Diagnosis not present

## 2020-04-25 DIAGNOSIS — D34 Benign neoplasm of thyroid gland: Secondary | ICD-10-CM | POA: Diagnosis not present

## 2020-04-25 DIAGNOSIS — E039 Hypothyroidism, unspecified: Secondary | ICD-10-CM | POA: Diagnosis not present

## 2020-04-25 DIAGNOSIS — M810 Age-related osteoporosis without current pathological fracture: Secondary | ICD-10-CM | POA: Diagnosis not present

## 2020-04-25 DIAGNOSIS — Z0000 Encounter for general adult medical examination without abnormal findings: Secondary | ICD-10-CM | POA: Diagnosis not present

## 2020-05-01 DIAGNOSIS — Z4889 Encounter for other specified surgical aftercare: Secondary | ICD-10-CM | POA: Diagnosis not present

## 2020-05-01 DIAGNOSIS — M79672 Pain in left foot: Secondary | ICD-10-CM | POA: Diagnosis not present

## 2020-05-01 DIAGNOSIS — M2042 Other hammer toe(s) (acquired), left foot: Secondary | ICD-10-CM | POA: Diagnosis not present

## 2020-05-02 DIAGNOSIS — H5203 Hypermetropia, bilateral: Secondary | ICD-10-CM | POA: Diagnosis not present

## 2020-05-02 DIAGNOSIS — Z961 Presence of intraocular lens: Secondary | ICD-10-CM | POA: Diagnosis not present

## 2020-05-02 DIAGNOSIS — H524 Presbyopia: Secondary | ICD-10-CM | POA: Diagnosis not present

## 2020-05-20 DIAGNOSIS — M79641 Pain in right hand: Secondary | ICD-10-CM | POA: Insufficient documentation

## 2020-05-20 DIAGNOSIS — M65832 Other synovitis and tenosynovitis, left forearm: Secondary | ICD-10-CM | POA: Diagnosis not present

## 2020-05-28 DIAGNOSIS — R350 Frequency of micturition: Secondary | ICD-10-CM | POA: Diagnosis not present

## 2020-05-28 DIAGNOSIS — R351 Nocturia: Secondary | ICD-10-CM | POA: Diagnosis not present

## 2020-05-28 DIAGNOSIS — N3946 Mixed incontinence: Secondary | ICD-10-CM | POA: Diagnosis not present

## 2020-05-30 DIAGNOSIS — M13849 Other specified arthritis, unspecified hand: Secondary | ICD-10-CM | POA: Diagnosis not present

## 2020-05-30 DIAGNOSIS — M659 Synovitis and tenosynovitis, unspecified: Secondary | ICD-10-CM | POA: Insufficient documentation

## 2020-05-30 DIAGNOSIS — M79642 Pain in left hand: Secondary | ICD-10-CM | POA: Diagnosis not present

## 2020-05-30 DIAGNOSIS — M65842 Other synovitis and tenosynovitis, left hand: Secondary | ICD-10-CM | POA: Diagnosis not present

## 2020-05-30 DIAGNOSIS — M65942 Unspecified synovitis and tenosynovitis, left hand: Secondary | ICD-10-CM | POA: Insufficient documentation

## 2020-05-30 DIAGNOSIS — M1812 Unilateral primary osteoarthritis of first carpometacarpal joint, left hand: Secondary | ICD-10-CM | POA: Diagnosis not present

## 2020-07-03 DIAGNOSIS — H903 Sensorineural hearing loss, bilateral: Secondary | ICD-10-CM | POA: Diagnosis not present

## 2020-07-03 DIAGNOSIS — H9313 Tinnitus, bilateral: Secondary | ICD-10-CM | POA: Diagnosis not present

## 2020-07-03 DIAGNOSIS — J301 Allergic rhinitis due to pollen: Secondary | ICD-10-CM | POA: Diagnosis not present

## 2020-07-04 DIAGNOSIS — R350 Frequency of micturition: Secondary | ICD-10-CM | POA: Diagnosis not present

## 2020-07-04 DIAGNOSIS — N3946 Mixed incontinence: Secondary | ICD-10-CM | POA: Diagnosis not present

## 2020-07-04 DIAGNOSIS — R351 Nocturia: Secondary | ICD-10-CM | POA: Diagnosis not present

## 2020-07-10 DIAGNOSIS — D2261 Melanocytic nevi of right upper limb, including shoulder: Secondary | ICD-10-CM | POA: Diagnosis not present

## 2020-07-10 DIAGNOSIS — Z85820 Personal history of malignant melanoma of skin: Secondary | ICD-10-CM | POA: Diagnosis not present

## 2020-07-10 DIAGNOSIS — D2262 Melanocytic nevi of left upper limb, including shoulder: Secondary | ICD-10-CM | POA: Diagnosis not present

## 2020-07-10 DIAGNOSIS — L821 Other seborrheic keratosis: Secondary | ICD-10-CM | POA: Diagnosis not present

## 2020-07-10 DIAGNOSIS — N3946 Mixed incontinence: Secondary | ICD-10-CM | POA: Diagnosis not present

## 2020-08-21 DIAGNOSIS — N3946 Mixed incontinence: Secondary | ICD-10-CM | POA: Diagnosis not present

## 2020-08-21 DIAGNOSIS — R350 Frequency of micturition: Secondary | ICD-10-CM | POA: Diagnosis not present

## 2020-09-26 DIAGNOSIS — N3946 Mixed incontinence: Secondary | ICD-10-CM | POA: Diagnosis not present

## 2020-09-26 DIAGNOSIS — R350 Frequency of micturition: Secondary | ICD-10-CM | POA: Diagnosis not present

## 2020-10-23 DIAGNOSIS — E039 Hypothyroidism, unspecified: Secondary | ICD-10-CM | POA: Diagnosis not present

## 2020-11-05 ENCOUNTER — Other Ambulatory Visit: Payer: Self-pay | Admitting: Family Medicine

## 2020-11-05 DIAGNOSIS — Z1231 Encounter for screening mammogram for malignant neoplasm of breast: Secondary | ICD-10-CM

## 2020-12-10 DIAGNOSIS — R350 Frequency of micturition: Secondary | ICD-10-CM | POA: Diagnosis not present

## 2020-12-10 DIAGNOSIS — N3946 Mixed incontinence: Secondary | ICD-10-CM | POA: Diagnosis not present

## 2020-12-17 DIAGNOSIS — R350 Frequency of micturition: Secondary | ICD-10-CM | POA: Diagnosis not present

## 2020-12-24 DIAGNOSIS — R350 Frequency of micturition: Secondary | ICD-10-CM | POA: Diagnosis not present

## 2020-12-31 ENCOUNTER — Ambulatory Visit
Admission: RE | Admit: 2020-12-31 | Discharge: 2020-12-31 | Disposition: A | Discharge status: Home / Self Care | Payer: Medicare Other | Source: Ambulatory Visit | Attending: Family Medicine | Admitting: Family Medicine

## 2020-12-31 ENCOUNTER — Other Ambulatory Visit: Payer: Self-pay

## 2020-12-31 DIAGNOSIS — Z1231 Encounter for screening mammogram for malignant neoplasm of breast: Secondary | ICD-10-CM

## 2020-12-31 DIAGNOSIS — R350 Frequency of micturition: Secondary | ICD-10-CM | POA: Diagnosis not present

## 2021-01-07 DIAGNOSIS — N3946 Mixed incontinence: Secondary | ICD-10-CM | POA: Diagnosis not present

## 2021-01-14 DIAGNOSIS — L821 Other seborrheic keratosis: Secondary | ICD-10-CM | POA: Diagnosis not present

## 2021-01-14 DIAGNOSIS — D225 Melanocytic nevi of trunk: Secondary | ICD-10-CM | POA: Diagnosis not present

## 2021-01-14 DIAGNOSIS — N3946 Mixed incontinence: Secondary | ICD-10-CM | POA: Diagnosis not present

## 2021-01-14 DIAGNOSIS — L812 Freckles: Secondary | ICD-10-CM | POA: Diagnosis not present

## 2021-01-14 DIAGNOSIS — Z85820 Personal history of malignant melanoma of skin: Secondary | ICD-10-CM | POA: Diagnosis not present

## 2021-01-14 DIAGNOSIS — R350 Frequency of micturition: Secondary | ICD-10-CM | POA: Diagnosis not present

## 2021-01-15 DIAGNOSIS — H524 Presbyopia: Secondary | ICD-10-CM | POA: Diagnosis not present

## 2021-01-21 DIAGNOSIS — R350 Frequency of micturition: Secondary | ICD-10-CM | POA: Diagnosis not present

## 2021-01-28 DIAGNOSIS — R350 Frequency of micturition: Secondary | ICD-10-CM | POA: Diagnosis not present

## 2021-02-04 DIAGNOSIS — N3946 Mixed incontinence: Secondary | ICD-10-CM | POA: Diagnosis not present

## 2021-02-04 DIAGNOSIS — R350 Frequency of micturition: Secondary | ICD-10-CM | POA: Diagnosis not present

## 2021-02-11 DIAGNOSIS — R350 Frequency of micturition: Secondary | ICD-10-CM | POA: Diagnosis not present

## 2021-02-11 DIAGNOSIS — N3946 Mixed incontinence: Secondary | ICD-10-CM | POA: Diagnosis not present

## 2021-02-25 DIAGNOSIS — R350 Frequency of micturition: Secondary | ICD-10-CM | POA: Diagnosis not present

## 2021-03-04 DIAGNOSIS — R350 Frequency of micturition: Secondary | ICD-10-CM | POA: Diagnosis not present

## 2021-03-04 DIAGNOSIS — N3946 Mixed incontinence: Secondary | ICD-10-CM | POA: Diagnosis not present

## 2021-04-01 DIAGNOSIS — N3946 Mixed incontinence: Secondary | ICD-10-CM | POA: Diagnosis not present

## 2021-05-06 DIAGNOSIS — D34 Benign neoplasm of thyroid gland: Secondary | ICD-10-CM | POA: Diagnosis not present

## 2021-05-06 DIAGNOSIS — E039 Hypothyroidism, unspecified: Secondary | ICD-10-CM | POA: Diagnosis not present

## 2021-05-06 DIAGNOSIS — M810 Age-related osteoporosis without current pathological fracture: Secondary | ICD-10-CM | POA: Diagnosis not present

## 2021-05-06 DIAGNOSIS — Z0000 Encounter for general adult medical examination without abnormal findings: Secondary | ICD-10-CM | POA: Diagnosis not present

## 2021-05-06 DIAGNOSIS — Z131 Encounter for screening for diabetes mellitus: Secondary | ICD-10-CM | POA: Diagnosis not present

## 2021-05-12 DIAGNOSIS — H524 Presbyopia: Secondary | ICD-10-CM | POA: Diagnosis not present

## 2021-05-12 DIAGNOSIS — H5203 Hypermetropia, bilateral: Secondary | ICD-10-CM | POA: Diagnosis not present

## 2021-05-12 DIAGNOSIS — Z961 Presence of intraocular lens: Secondary | ICD-10-CM | POA: Diagnosis not present

## 2021-05-22 DIAGNOSIS — N3946 Mixed incontinence: Secondary | ICD-10-CM | POA: Diagnosis not present

## 2021-05-22 DIAGNOSIS — R350 Frequency of micturition: Secondary | ICD-10-CM | POA: Diagnosis not present

## 2021-06-16 DIAGNOSIS — M6281 Muscle weakness (generalized): Secondary | ICD-10-CM | POA: Diagnosis not present

## 2021-06-16 DIAGNOSIS — M6289 Other specified disorders of muscle: Secondary | ICD-10-CM | POA: Diagnosis not present

## 2021-06-16 DIAGNOSIS — N3946 Mixed incontinence: Secondary | ICD-10-CM | POA: Diagnosis not present

## 2021-06-16 DIAGNOSIS — M62838 Other muscle spasm: Secondary | ICD-10-CM | POA: Diagnosis not present

## 2021-06-26 DIAGNOSIS — Z20822 Contact with and (suspected) exposure to covid-19: Secondary | ICD-10-CM | POA: Diagnosis not present

## 2021-07-02 DIAGNOSIS — R350 Frequency of micturition: Secondary | ICD-10-CM | POA: Diagnosis not present

## 2021-07-02 DIAGNOSIS — N3946 Mixed incontinence: Secondary | ICD-10-CM | POA: Diagnosis not present

## 2021-07-02 DIAGNOSIS — R351 Nocturia: Secondary | ICD-10-CM | POA: Diagnosis not present

## 2021-07-02 DIAGNOSIS — M6281 Muscle weakness (generalized): Secondary | ICD-10-CM | POA: Diagnosis not present

## 2021-07-07 DIAGNOSIS — R059 Cough, unspecified: Secondary | ICD-10-CM | POA: Diagnosis not present

## 2021-07-17 DIAGNOSIS — G4700 Insomnia, unspecified: Secondary | ICD-10-CM | POA: Diagnosis not present

## 2021-07-17 DIAGNOSIS — H9190 Unspecified hearing loss, unspecified ear: Secondary | ICD-10-CM | POA: Diagnosis not present

## 2021-07-17 DIAGNOSIS — R32 Unspecified urinary incontinence: Secondary | ICD-10-CM | POA: Diagnosis not present

## 2021-07-17 DIAGNOSIS — M419 Scoliosis, unspecified: Secondary | ICD-10-CM | POA: Diagnosis not present

## 2021-07-21 ENCOUNTER — Other Ambulatory Visit: Payer: Self-pay | Admitting: Internal Medicine

## 2021-07-21 DIAGNOSIS — E041 Nontoxic single thyroid nodule: Secondary | ICD-10-CM

## 2021-07-22 DIAGNOSIS — N3946 Mixed incontinence: Secondary | ICD-10-CM | POA: Diagnosis not present

## 2021-07-22 DIAGNOSIS — R350 Frequency of micturition: Secondary | ICD-10-CM | POA: Diagnosis not present

## 2021-07-22 DIAGNOSIS — R351 Nocturia: Secondary | ICD-10-CM | POA: Diagnosis not present

## 2021-07-22 DIAGNOSIS — M6281 Muscle weakness (generalized): Secondary | ICD-10-CM | POA: Diagnosis not present

## 2021-07-24 DIAGNOSIS — L438 Other lichen planus: Secondary | ICD-10-CM | POA: Diagnosis not present

## 2021-07-24 DIAGNOSIS — Z85820 Personal history of malignant melanoma of skin: Secondary | ICD-10-CM | POA: Diagnosis not present

## 2021-07-24 DIAGNOSIS — L821 Other seborrheic keratosis: Secondary | ICD-10-CM | POA: Diagnosis not present

## 2021-07-24 DIAGNOSIS — L812 Freckles: Secondary | ICD-10-CM | POA: Diagnosis not present

## 2021-07-30 ENCOUNTER — Ambulatory Visit
Admission: RE | Admit: 2021-07-30 | Discharge: 2021-07-30 | Disposition: A | Discharge status: Home / Self Care | Payer: Medicare Other | Source: Ambulatory Visit | Attending: Internal Medicine | Admitting: Internal Medicine

## 2021-07-30 DIAGNOSIS — E890 Postprocedural hypothyroidism: Secondary | ICD-10-CM | POA: Diagnosis not present

## 2021-07-30 DIAGNOSIS — E041 Nontoxic single thyroid nodule: Secondary | ICD-10-CM

## 2021-08-05 DIAGNOSIS — N3946 Mixed incontinence: Secondary | ICD-10-CM | POA: Diagnosis not present

## 2021-08-05 DIAGNOSIS — M6281 Muscle weakness (generalized): Secondary | ICD-10-CM | POA: Diagnosis not present

## 2021-08-05 DIAGNOSIS — R350 Frequency of micturition: Secondary | ICD-10-CM | POA: Diagnosis not present

## 2021-08-05 DIAGNOSIS — R351 Nocturia: Secondary | ICD-10-CM | POA: Diagnosis not present

## 2021-08-08 ENCOUNTER — Other Ambulatory Visit: Payer: Self-pay | Admitting: Internal Medicine

## 2021-08-08 DIAGNOSIS — E041 Nontoxic single thyroid nodule: Secondary | ICD-10-CM

## 2021-08-13 DIAGNOSIS — E041 Nontoxic single thyroid nodule: Secondary | ICD-10-CM | POA: Diagnosis not present

## 2021-08-25 DIAGNOSIS — R351 Nocturia: Secondary | ICD-10-CM | POA: Diagnosis not present

## 2021-08-25 DIAGNOSIS — M6281 Muscle weakness (generalized): Secondary | ICD-10-CM | POA: Diagnosis not present

## 2021-08-25 DIAGNOSIS — R350 Frequency of micturition: Secondary | ICD-10-CM | POA: Diagnosis not present

## 2021-08-25 DIAGNOSIS — N3946 Mixed incontinence: Secondary | ICD-10-CM | POA: Diagnosis not present

## 2021-09-03 DIAGNOSIS — N3946 Mixed incontinence: Secondary | ICD-10-CM | POA: Diagnosis not present

## 2021-09-16 IMAGING — MG MM DIGITAL SCREENING BILAT W/ TOMO AND CAD
8 series · 9 of 24 positions shown · non-contrast
Comparison: Previous exam(s).

CLINICAL DATA: Screening.

EXAM:
DIGITAL SCREENING BILATERAL MAMMOGRAM WITH TOMOSYNTHESIS AND CAD
TECHNIQUE: Bilateral screening digital craniocaudal and mediolateral oblique
mammograms were obtained. Bilateral screening digital breast
tomosynthesis was performed. The images were evaluated with
computer-aided detection.

[R CC synth-2D]
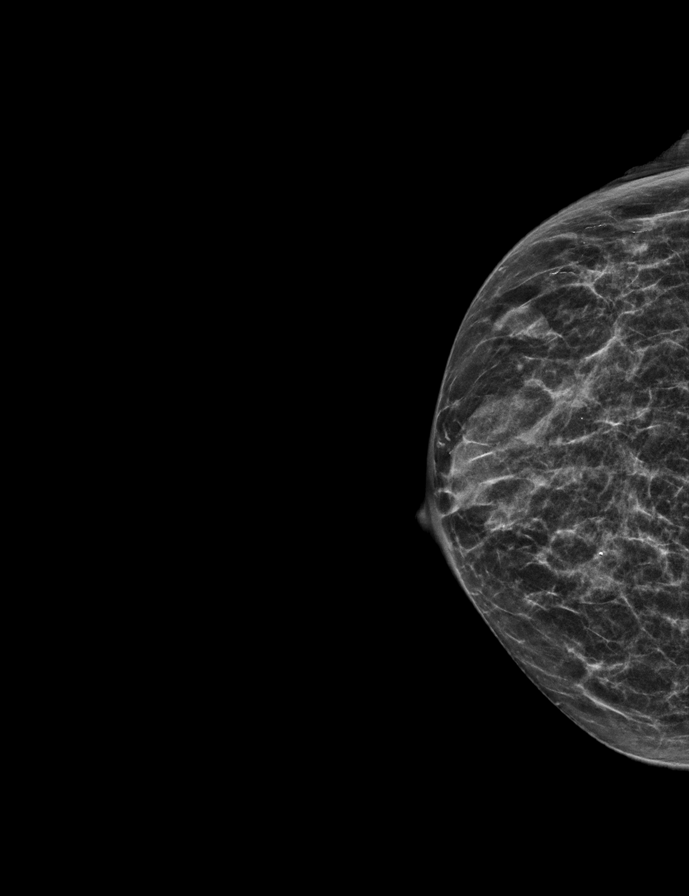

[L MLO synth-2D]
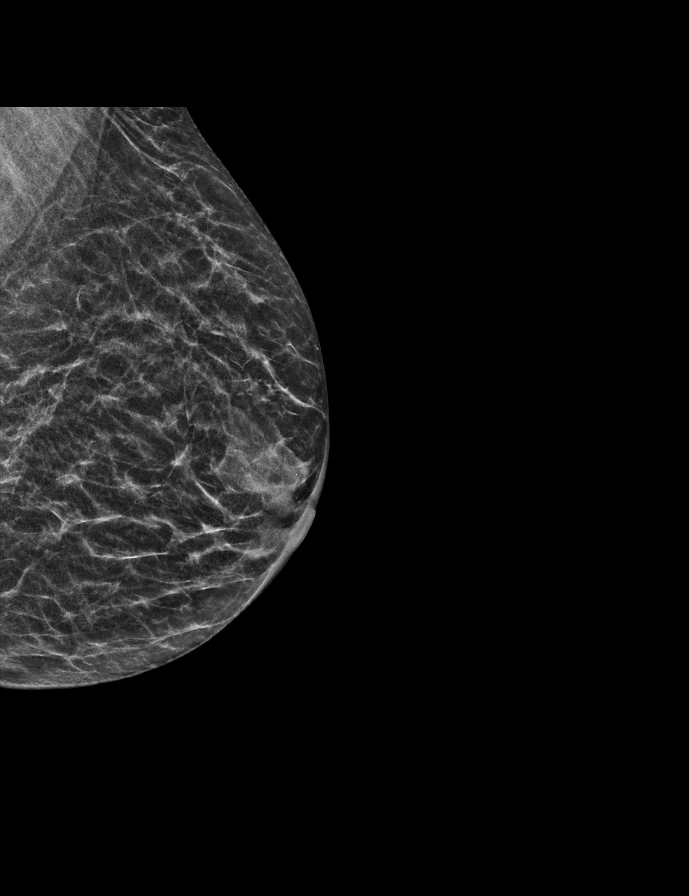

[R MLO synth-2D]
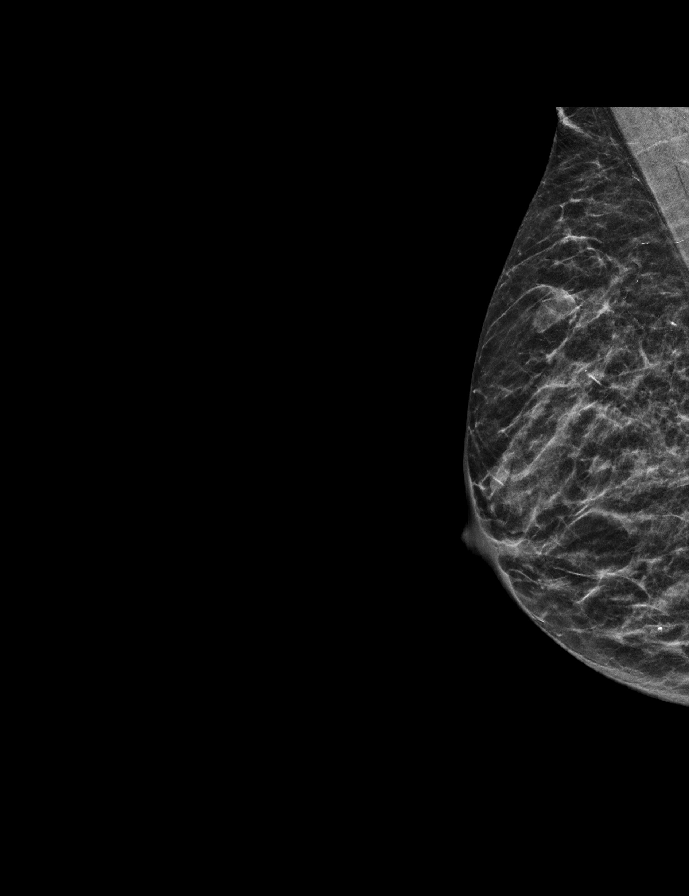

[L CC synth-2D]
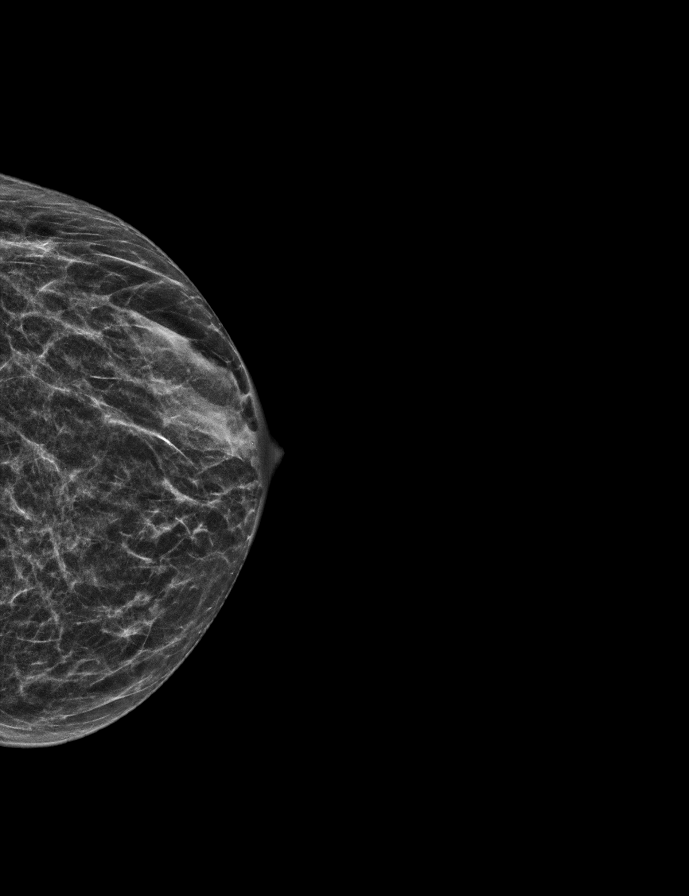

[R CC tomo · 2 of 39 frames shown]
[frame 13/39]
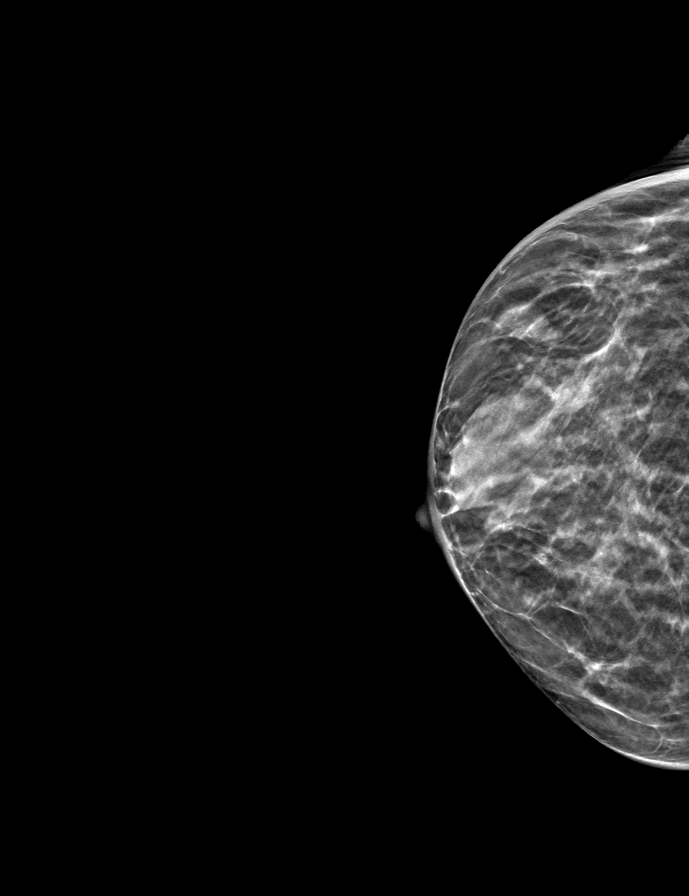
[frame 20/39]
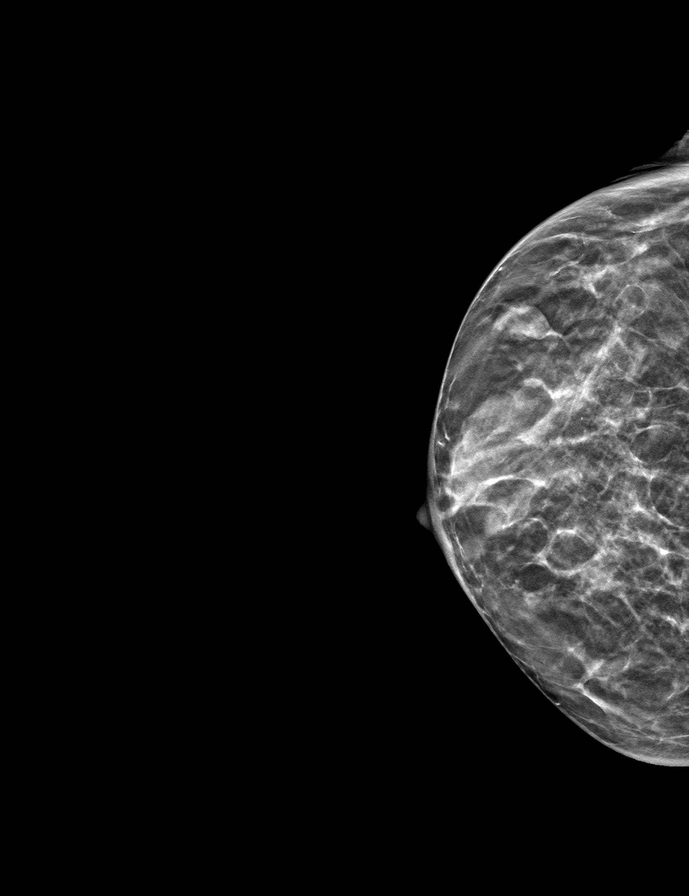

[L CC tomo · tomo slice 19/38.0]
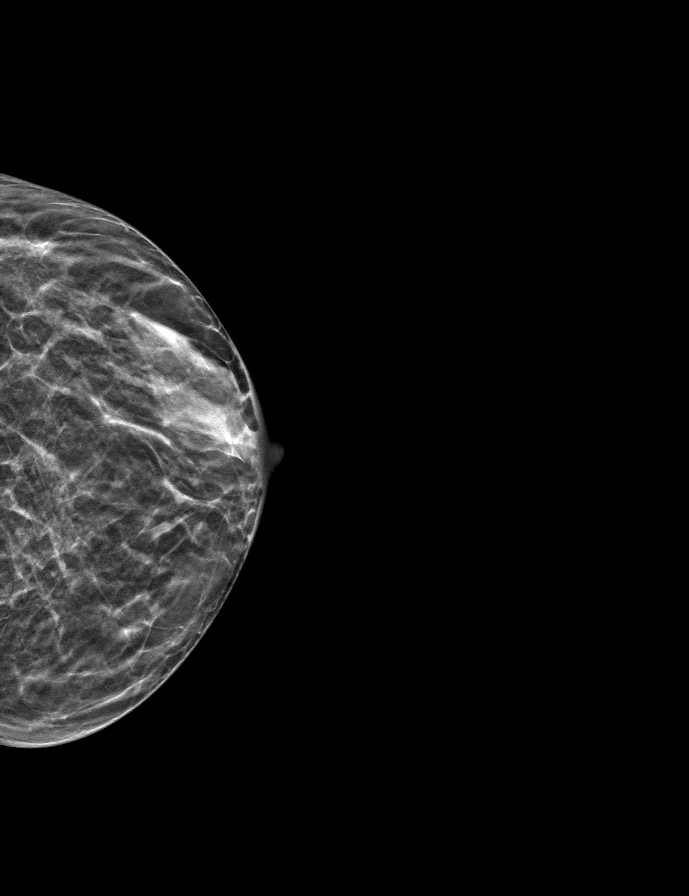

[R MLO tomo · tomo slice 20/39.0]
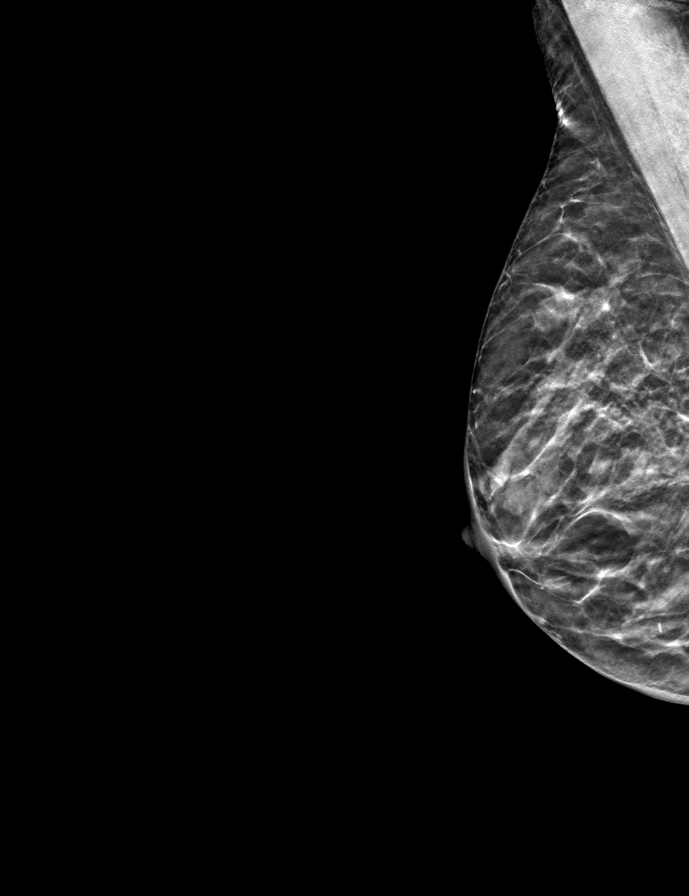

[L MLO tomo · tomo slice 19/37.0]
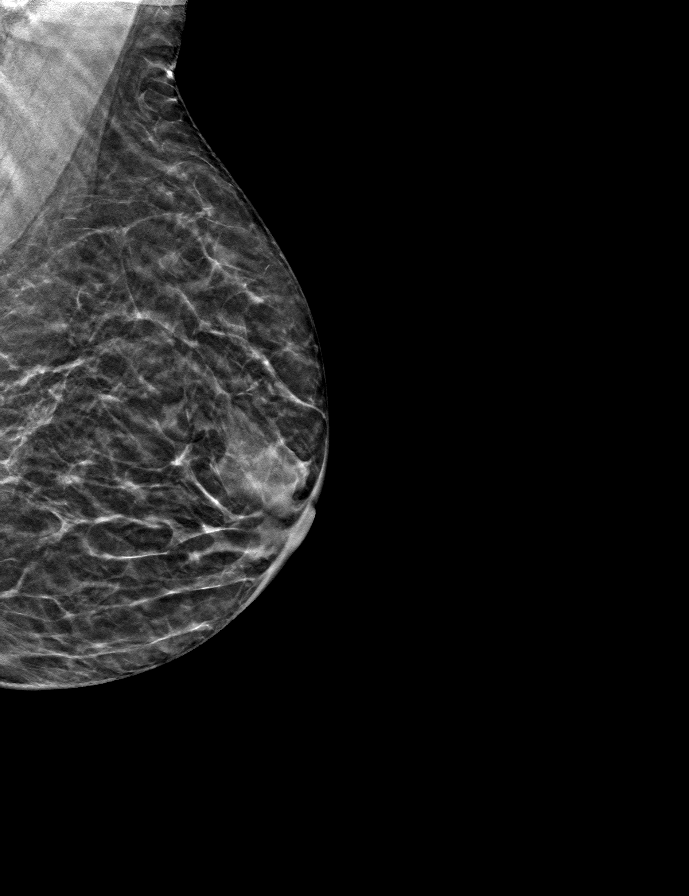

[9 of 24 positions shown; findings below may reference images not displayed]

ACR Breast Density Category c: The breast tissue is heterogeneously
dense, which may obscure small masses.
FINDINGS: There are no findings suspicious for malignancy.
IMPRESSION: No mammographic evidence of malignancy. A result letter of this
screening mammogram will be mailed directly to the patient.

RECOMMENDATION:
Screening mammogram in one year. (Code:Q3-W-BC3)

BI-RADS CATEGORY  1: Negative.

## 2021-09-22 DIAGNOSIS — N3946 Mixed incontinence: Secondary | ICD-10-CM | POA: Diagnosis not present

## 2021-09-22 DIAGNOSIS — R351 Nocturia: Secondary | ICD-10-CM | POA: Diagnosis not present

## 2021-09-22 DIAGNOSIS — M6281 Muscle weakness (generalized): Secondary | ICD-10-CM | POA: Diagnosis not present

## 2021-09-22 DIAGNOSIS — R350 Frequency of micturition: Secondary | ICD-10-CM | POA: Diagnosis not present

## 2021-09-30 DIAGNOSIS — N3941 Urge incontinence: Secondary | ICD-10-CM | POA: Diagnosis not present

## 2021-10-06 DIAGNOSIS — M6281 Muscle weakness (generalized): Secondary | ICD-10-CM | POA: Diagnosis not present

## 2021-10-06 DIAGNOSIS — R350 Frequency of micturition: Secondary | ICD-10-CM | POA: Diagnosis not present

## 2021-10-06 DIAGNOSIS — R351 Nocturia: Secondary | ICD-10-CM | POA: Diagnosis not present

## 2021-10-06 DIAGNOSIS — N3946 Mixed incontinence: Secondary | ICD-10-CM | POA: Diagnosis not present

## 2021-10-09 ENCOUNTER — Other Ambulatory Visit (HOSPITAL_COMMUNITY): Payer: Self-pay | Admitting: Family Medicine

## 2021-10-09 ENCOUNTER — Ambulatory Visit (HOSPITAL_COMMUNITY)
Admission: RE | Admit: 2021-10-09 | Discharge: 2021-10-09 | Disposition: A | Discharge status: Home / Self Care | Payer: Medicare Other | Source: Ambulatory Visit | Attending: Family Medicine | Admitting: Family Medicine

## 2021-10-09 DIAGNOSIS — R600 Localized edema: Secondary | ICD-10-CM | POA: Diagnosis not present

## 2021-10-09 DIAGNOSIS — R202 Paresthesia of skin: Secondary | ICD-10-CM | POA: Diagnosis not present

## 2021-10-09 DIAGNOSIS — M7989 Other specified soft tissue disorders: Secondary | ICD-10-CM | POA: Diagnosis not present

## 2021-10-09 DIAGNOSIS — M419 Scoliosis, unspecified: Secondary | ICD-10-CM | POA: Diagnosis not present

## 2021-10-09 DIAGNOSIS — M79604 Pain in right leg: Secondary | ICD-10-CM

## 2021-10-09 DIAGNOSIS — C439 Malignant melanoma of skin, unspecified: Secondary | ICD-10-CM | POA: Diagnosis not present

## 2021-10-10 DIAGNOSIS — R350 Frequency of micturition: Secondary | ICD-10-CM | POA: Diagnosis not present

## 2021-10-10 DIAGNOSIS — N3946 Mixed incontinence: Secondary | ICD-10-CM | POA: Diagnosis not present

## 2021-11-12 ENCOUNTER — Encounter: Payer: Self-pay | Admitting: *Deleted

## 2021-11-14 DIAGNOSIS — R350 Frequency of micturition: Secondary | ICD-10-CM | POA: Diagnosis not present

## 2021-11-14 DIAGNOSIS — M6281 Muscle weakness (generalized): Secondary | ICD-10-CM | POA: Diagnosis not present

## 2021-11-14 DIAGNOSIS — R351 Nocturia: Secondary | ICD-10-CM | POA: Diagnosis not present

## 2021-11-14 DIAGNOSIS — N3946 Mixed incontinence: Secondary | ICD-10-CM | POA: Diagnosis not present

## 2021-11-17 ENCOUNTER — Ambulatory Visit: Payer: Medicare Other | Admitting: Diagnostic Neuroimaging

## 2021-11-17 ENCOUNTER — Encounter: Payer: Self-pay | Admitting: Diagnostic Neuroimaging

## 2021-11-17 VITALS — BP 136/75 | HR 62 | Ht 63.0 in | Wt 174.0 lb

## 2021-11-17 DIAGNOSIS — G629 Polyneuropathy, unspecified: Secondary | ICD-10-CM

## 2021-11-17 NOTE — Progress Notes (Signed)
[facility]  PATIENT: Morgan Gregory DOB: Mar 15, 1941  REFERRING CLINICIAN: Geoffery Lyons, NP HISTORY FROM: patient  REASON FOR VISIT: new consult    HISTORICAL  CHIEF COMPLAINT:  Chief Complaint  Patient presents with   New Patient (Initial Visit)    Pt is feeling fine today. She states she started feeling the numbness after her right knee sx in 2019. She states the numbness stared in both feet up to her knees. She also reports she started taking sunflower seeds that she found on the internet to help relief. Room 6 alone.    HISTORY OF PRESENT ILLNESS:   81 year old female here for evaluation of lower extremity numbness.  Symptoms started around 2019.  She feels numbness and tingling in her toes and feet.  Also feels coldness sensation.  Symptoms have been slightly progressive over time.  No low back pain.  No problems with fingers or hands.   REVIEW OF SYSTEMS: Full 14 system review of systems performed and negative with exception of: as per HPI.  ALLERGIES: Allergies  Allergen Reactions   Augmentin [Amoxicillin-Pot Clavulanate] Diarrhea and Nausea Only    Has patient had a PCN reaction causing immediate rash, facial/tongue/throat swelling, SOB or lightheadedness with hypotension: No Has patient had a PCN reaction causing severe rash involving mucus membranes or skin necrosis: No Has patient had a PCN reaction that required hospitalization No Has patient had a PCN reaction occurring within the last 10 years: No If all of the above answers are "NO", then may proceed with Cephalosporin use.    Erythromycin Diarrhea and Nausea Only    Severe diarrhea    HOME MEDICATIONS: Outpatient Medications Prior to Visit  Medication Sig Dispense Refill   acetaminophen (TYLENOL) 500 MG tablet Take 2 tablets (1,000 mg total) by mouth every 6 (six) hours. (Patient taking differently: Take 1,000 mg by mouth every 6 (six) hours as needed.) 30 tablet 0    cholecalciferol (VITAMIN D) 1000 UNITS tablet Take 1,000 Units by mouth daily.     levothyroxine (SYNTHROID, LEVOTHROID) 50 MCG tablet Take 50 mcg by mouth daily.      Polyethyl Glycol-Propyl Glycol (SYSTANE OP) Apply 1 drop to eye 2 (two) times daily as needed (dry eyes).     ciprofloxacin (CIPRO) 500 MG tablet SMARTSIG:1 Tablet(s) By Mouth Every 12 Hours (Patient not taking: Reported on 11/17/2021)     fluticasone furoate-vilanterol (BREO ELLIPTA) 100-25 MCG/INH AEPB Inhale 1 puff into the lungs daily. (Patient not taking: Reported on 11/17/2021) 1 each 0   fluticasone furoate-vilanterol (BREO ELLIPTA) 100-25 MCG/INH AEPB Inhale 1 puff into the lungs daily. (Patient not taking: Reported on 11/17/2021) 60 each 3   levalbuterol (XOPENEX HFA) 45 MCG/ACT inhaler Inhale 2 puffs into the lungs every 4 (four) hours as needed for wheezing or shortness of breath. (Patient not taking: Reported on 11/17/2021) 1 Inhaler 12   MYRBETRIQ 25 MG TB24 tablet Take 25 mg by mouth daily. (Patient not taking: Reported on 11/17/2021)     OVER THE COUNTER MEDICATION Take 10 mg by mouth as needed. (Patient not taking: Reported on 11/17/2021)     zolpidem (AMBIEN) 10 MG tablet Take 5 mg by mouth at bedtime.  (Patient not taking: Reported on 11/17/2021)     No facility-administered medications prior to visit.    PAST MEDICAL HISTORY: Past Medical History:  Diagnosis Date   Arthritis    Cancer ([place]) 1968   melanoma on right shin   Colon polyps  DDD (degenerative disc disease), cervical 03/29/2009   DG Cervical Spine Complete   Diverticulosis of sigmoid colon 01/2017   Edema    right lower leg/foot due to s/p melanoma excision   Gastritis    GERD (gastroesophageal reflux disease)    03-05-15 not an issue now   History of kidney stones    Hypothyroidism    IBS (irritable bowel syndrome)    Paresthesia    skin   Spondylosis 03/29/2009   DG Cervical Spine Complete   Thyroid disease    hypothyroid    PAST  SURGICAL HISTORY: Past Surgical History:  Procedure Laterality Date   ABDOMINAL HYSTERECTOMY  1979   CATARACT EXTRACTION, BILATERAL     8'15,. [place]  2007   posterior approach-no retained hardware   COLONOSCOPY     FOOT SURGERY Bilateral    hammertoe, neuromas, bunionectomy   MELANOMA EXCISION  1968   right shin/calf- calf measures 0.75 inches larger than left   THYROIDECTOMY, PARTIAL  2011   TONSILLECTOMY     TOTAL KNEE ARTHROPLASTY Left 03/19/2015   Procedure: LEFT TOTAL KNEE ARTHROPLASTY;  Surgeon: Paralee Cancel, MD;  Location: [facility];  Service: Orthopedics;  Laterality: Left;   TOTAL KNEE ARTHROPLASTY Right 04/27/2017   Procedure: RIGHT TOTAL KNEE ARTHROPLASTY;  Surgeon: Paralee Cancel, MD;  Location: [facility];  Service: Orthopedics;  Laterality: Right;  70 mins    FAMILY HISTORY: Family History  Problem Relation Age of Onset   Colon cancer Mother 11   Emphysema Father    Heart attack Brother 24   Esophageal cancer Neg Hx    Rectal cancer Neg Hx    Stomach cancer Neg Hx     SOCIAL HISTORY: Social History   Socioeconomic History   Marital status: Widowed    Spouse name: Not on file   Number of children: Not on file   Years of education: Not on file   Highest education level: Not on file  Occupational History   Not on file  Tobacco Use   Smoking status: Former    Packs/day: 0.75    Years: 6.00    Total pack years: 4.50    Types: Cigarettes    Start date: 40    Quit date: 03/04/1965    Years since quitting: 56.7   Smokeless tobacco: Never  Vaping Use   Vaping Use: Never used  Substance and Sexual Activity   Alcohol use: Yes    Alcohol/week: 10.0 standard drinks of alcohol    Types: 10 Glasses of wine per week    Comment: wine 1 glass daily   Drug use: No   Sexual activity: Not Currently  Other Topics Concern   Not on file  Social History Narrative   11/12/21 Lives alone, widow, [facility].   3 step children.     Social  Determinants of Health   Financial Resource Strain: Not on file  Food Insecurity: Not on file  Transportation Needs: Not on file  Physical Activity: Not on file  Stress: Not on file  Social Connections: Not on file  Intimate Partner Violence: Not on file     PHYSICAL EXAM  GENERAL EXAM/CONSTITUTIONAL: Vitals:  Vitals:   11/17/21 0902  BP: 136/75  Pulse: 62  Weight: 174 lb (78.9 kg)  Height: '5\' 3"'$  (1.6 m)   Body mass index is 30.82 kg/m. Wt Readings from Last 3 Encounters:  11/17/21 174 lb (78.9 kg)  09/05/19 143 lb 12.8 oz (65.2  kg)  08/14/19 131 lb 12.8 oz (59.8 kg)   Patient is in no distress; well developed, nourished and groomed; neck is supple  CARDIOVASCULAR: Examination of carotid arteries is normal; no carotid bruits Regular rate and rhythm, no murmurs Examination of peripheral vascular system by observation and palpation is normal  EYES: Ophthalmoscopic exam of optic discs and posterior segments is normal; no papilledema or hemorrhages No results found.  MUSCULOSKELETAL: Gait, strength, tone, movements noted in Neurologic exam below  NEUROLOGIC: MENTAL STATUS:      No data to display         awake, alert, oriented to person, place and time recent and remote memory intact normal attention and concentration language fluent, comprehension intact, naming intact fund of knowledge appropriate  CRANIAL NERVE:  2nd - no papilledema on fundoscopic exam 2nd, 3rd, 4th, 6th - pupils equal and reactive to light, visual fields full to confrontation, extraocular muscles intact, no nystagmus 5th - facial sensation symmetric 7th - facial strength symmetric 8th - hearing intact 9th - palate elevates symmetrically, uvula midline 11th - shoulder shrug symmetric 12th - tongue protrusion midline  MOTOR:  normal bulk and tone, full strength in the BUE, BLE  SENSORY:  normal and symmetric to light touch, temperature, vibration  COORDINATION:   finger-nose-finger, fine finger movements normal  REFLEXES:  deep tendon reflexes TRACE and symmetric  GAIT/STATION:  narrow based gait     DIAGNOSTIC DATA (LABS, IMAGING, TESTING) - I reviewed patient records, labs, notes, testing and imaging myself where available.  Lab Results  Component Value Date   WBC 6.6 07/10/2019   HGB 15.6 (H) 07/10/2019   HCT 46.6 (H) 07/10/2019   MCV 92.6 07/10/2019   PLT 233.0 07/10/2019      Component Value Date/Time   NA 138 04/28/2017 0555   K 4.2 04/28/2017 0555   CL 105 04/28/2017 0555   CO2 28 04/28/2017 0555   GLUCOSE 144 (H) 04/28/2017 0555   BUN 15 04/28/2017 0555   CREATININE 0.68 04/28/2017 0555   CALCIUM 8.3 (L) 04/28/2017 0555   PROT 6.6 12/12/2009 1145   ALBUMIN 4.1 12/12/2009 1145   AST 21 12/12/2009 1145   ALT 12 12/12/2009 1145   ALKPHOS 53 12/12/2009 1145   BILITOT 0.9 12/12/2009 1145   GFRNONAA >60 04/28/2017 0555   GFRAA >60 04/28/2017 0555   No results found for: "CHOL", "HDL", "LDLCALC", "LDLDIRECT", "TRIG", "CHOLHDL" No results found for: "HGBA1C" No results found for: "VITAMINB12" No results found for: "TSH"     ASSESSMENT AND PLAN  81 y.o. year old female here with:   Dx:  1. Neuropathy      PLAN:  Neuropathy (since ~2019)  - neuropathy labs  -consider capsaicin cream, lidocaine patch / cream, alpha-lipoic acid '600mg'$  daily or nervive supplement  -consider duloxetine 30-'60mg'$  daily, amitriptyline 25-'50mg'$  at bedtime, gabapentin 100-'300mg'$  three times a day, pregabalin 75-'150mg'$  twice a day  Orders Placed This Encounter  Procedures   A1c   TSH   SPEP with IFE   ANA w/Reflex   SSA, SSB   ANCA Profile   Copper   Vitamin B6   Vitamin B1   Return for pending if symptoms worsen or fail to improve, return to PCP.    Penni Bombard, MD 1/95/0932, 6:71 AM Certified in Neurology, Neurophysiology and Neuroimaging   [facility] [address] [place],  [place] [address] [phone]

## 2021-11-17 NOTE — Patient Instructions (Signed)
Neuropathy (since ~2019)  - neuropathy labs  -consider capsaicin cream, lidocaine patch / cream, alpha-lipoic acid '600mg'$  daily or nervive supplement  -consider duloxetine 30-'60mg'$  daily, amitriptyline 25-'50mg'$  at bedtime, gabapentin 100-'300mg'$  three times a day, pregabalin 75-'150mg'$  twice a day

## 2021-11-20 DIAGNOSIS — N3946 Mixed incontinence: Secondary | ICD-10-CM | POA: Diagnosis not present

## 2021-11-21 LAB — HEMOGLOBIN A1C
Est. average glucose Bld gHb Est-mCnc: 105 mg/dL
Hgb A1c MFr Bld: 5.3 % (ref 4.8–5.6)

## 2021-11-21 LAB — COPPER, SERUM: Copper: 111 ug/dL (ref 80–158)

## 2021-11-21 LAB — ANCA PROFILE
Anti-MPO Antibodies: <0.2 units (ref 0.0–0.9)
Anti-PR3 Antibodies: <0.2 units (ref 0.0–0.9)
Atypical pANCA: <1:20 {titer}
P-ANCA: <1:20 {titer}

## 2021-11-21 LAB — MULTIPLE MYELOMA PANEL, SERUM
Albumin SerPl Elph-Mcnc: 3.6 g/dL (ref 2.9–4.4)
Albumin/Glob SerPl: 1.5 (ref 0.7–1.7)
Alpha 1: 0.2 g/dL (ref 0.0–0.4)
Alpha2 Glob SerPl Elph-Mcnc: 0.6 g/dL (ref 0.4–1.0)
B-Globulin SerPl Elph-Mcnc: 0.9 g/dL (ref 0.7–1.3)
Gamma Glob SerPl Elph-Mcnc: 0.7 g/dL (ref 0.4–1.8)
Globulin, Total: 2.5 g/dL (ref 2.2–3.9)
IgA/Immunoglobulin A, Serum: 141 mg/dL (ref 64–422)
IgG (Immunoglobin G), Serum: 735 mg/dL (ref 586–1602)
IgM (Immunoglobulin M), Srm: 47 mg/dL (ref 26–217)
Total Protein: 6.1 g/dL (ref 6.0–8.5)

## 2021-11-21 LAB — ANA W/REFLEX: ANA Titer 1: NEGATIVE

## 2021-11-21 LAB — SJOGREN'S SYNDROME ANTIBODS(SSA + SSB)
ENA SSA (RO) Ab: <0.2 AI (ref 0.0–0.9)
ENA SSB (LA) Ab: <0.2 AI (ref 0.0–0.9)

## 2021-11-21 LAB — VITAMIN B6: Vitamin B6: 29.5 ug/L (ref 3.4–65.2)

## 2021-11-21 LAB — VITAMIN B1: Thiamine: 133.6 nmol/L (ref 66.5–200.0)

## 2021-11-21 LAB — TSH: TSH: 3.27 u[IU]/mL (ref 0.450–4.500)

## 2021-12-02 ENCOUNTER — Other Ambulatory Visit: Payer: Self-pay | Admitting: Internal Medicine

## 2021-12-02 DIAGNOSIS — Z1231 Encounter for screening mammogram for malignant neoplasm of breast: Secondary | ICD-10-CM

## 2021-12-08 DIAGNOSIS — N3946 Mixed incontinence: Secondary | ICD-10-CM | POA: Diagnosis not present

## 2021-12-08 DIAGNOSIS — M62838 Other muscle spasm: Secondary | ICD-10-CM | POA: Diagnosis not present

## 2021-12-08 DIAGNOSIS — R351 Nocturia: Secondary | ICD-10-CM | POA: Diagnosis not present

## 2021-12-08 DIAGNOSIS — R350 Frequency of micturition: Secondary | ICD-10-CM | POA: Diagnosis not present

## 2021-12-11 ENCOUNTER — Ambulatory Visit: Payer: Medicare Other | Admitting: Podiatry

## 2021-12-11 DIAGNOSIS — L600 Ingrowing nail: Secondary | ICD-10-CM | POA: Diagnosis not present

## 2021-12-11 DIAGNOSIS — G629 Polyneuropathy, unspecified: Secondary | ICD-10-CM | POA: Diagnosis not present

## 2021-12-11 NOTE — Progress Notes (Signed)
Subjective:   Patient ID: Morgan Gregory, female   DOB: 81 y.o.   MRN: [id number]   HPI Chief Complaint  Patient presents with   Peripheral Neuropathy   Ingrown Toenail    Patient came in today for a ingrown toenail on the right hallux, which started 6 weeks ago, rate of pain 9 out of 10, pt also has neuropathy bilaterally    81 year old female presents with above complaints.  She states the nails get ingrown particular the right hallux and gets tender.  She gets pedicures and they tried to trim it helps her day or so but the pain comes back.  Denies any swelling or redness or any drainage.   Review of Systems  All other systems reviewed and are negative.  Past Medical History:  Diagnosis Date   Arthritis    Cancer ([place]) 1968   melanoma on right shin   Colon polyps    DDD (degenerative disc disease), cervical 03/29/2009   DG Cervical Spine Complete   Diverticulosis of sigmoid colon 01/2017   Edema    right lower leg/foot due to s/p melanoma excision   Gastritis    GERD (gastroesophageal reflux disease)    03-05-15 not an issue now   History of kidney stones    Hypothyroidism    IBS (irritable bowel syndrome)    Paresthesia    skin   Spondylosis 03/29/2009   DG Cervical Spine Complete   Thyroid disease    hypothyroid    Past Surgical History:  Procedure Laterality Date   ABDOMINAL HYSTERECTOMY  1979   CATARACT EXTRACTION, BILATERAL     8'15,. [place]  2007   posterior approach-no retained hardware   COLONOSCOPY     FOOT SURGERY Bilateral    hammertoe, neuromas, bunionectomy   MELANOMA EXCISION  1968   right shin/calf- calf measures 0.75 inches larger than left   THYROIDECTOMY, PARTIAL  2011   TONSILLECTOMY     TOTAL KNEE ARTHROPLASTY Left 03/19/2015   Procedure: LEFT TOTAL KNEE ARTHROPLASTY;  Surgeon: Paralee Cancel, MD;  [facility]: [facility];  Service: Orthopedics;  Laterality: Left;   TOTAL KNEE ARTHROPLASTY Right 04/27/2017   Procedure:  RIGHT TOTAL KNEE ARTHROPLASTY;  Surgeon: Paralee Cancel, MD;  Location: [facility];  Service: Orthopedics;  Laterality: Right;  70 mins     Current Outpatient Medications:    acetaminophen (TYLENOL) 500 MG tablet, Take 2 tablets (1,000 mg total) by mouth every 6 (six) hours. (Patient taking differently: Take 1,000 mg by mouth every 6 (six) hours as needed.), Disp: 30 tablet, Rfl: 0   cholecalciferol (VITAMIN D) 1000 UNITS tablet, Take 1,000 Units by mouth daily., Disp: , Rfl:    levothyroxine (SYNTHROID, LEVOTHROID) 50 MCG tablet, Take 50 mcg by mouth daily. , Disp: , Rfl:    Magnesium Oxide 250 MG TABS, 1 tablet as needed Orally Once a day, Disp: , Rfl:    Polyethyl Glycol-Propyl Glycol (SYSTANE OP), Apply 1 drop to eye 2 (two) times daily as needed (dry eyes)., Disp: , Rfl:    solifenacin (VESICARE) 5 MG tablet, Take 5 mg by mouth daily., Disp: , Rfl:   Allergies  Allergen Reactions   Augmentin [Amoxicillin-Pot Clavulanate] Diarrhea and Nausea Only    Has patient had a PCN reaction causing immediate rash, facial/tongue/throat swelling, SOB or lightheadedness with hypotension: No Has patient had a PCN reaction causing severe rash involving mucus membranes or skin necrosis: No Has patient had a PCN reaction  that required hospitalization No Has patient had a PCN reaction occurring within the last 10 years: No If all of the above answers are "NO", then may proceed with Cephalosporin use.    Erythromycin Diarrhea and Nausea Only    Severe diarrhea          Objective:  Physical Exam  General: AAO x3, NAD  Dermatological: Nails are mildly hypertrophic, dystrophic and discolored.  Right hallux toenail there is incurvation of the nail border with tenderness palpation.  There is no edema, erythema, drainage or pus or any signs of infection.  There is no open lesions.  Vascular: Dorsalis Pedis artery and Posterior Tibial artery pedal pulses are 2/4 bilateral with immedate capillary fill time.  There is no pain with calf compression, swelling, warmth, erythema.   Neruologic: Grossly intact via light touch bilateral.  Sensation intact with Semmes Weinstein monofilament.  Musculoskeletal: Tenderness of ingrown toenail.  No other areas of discomfort.  Muscular strength 5/5 in all groups tested bilateral.  Gait: Unassisted, Nonantalgic.       Assessment:   Ingrown toenail, neuropathy     Plan:  -Treatment options discussed including all alternatives, risks, and complications -Etiology of symptoms were discussed -We discussed partial nail avulsion and she wants to proceed with this but given upcoming event she wants to wait.  Today I sharply debrided the nails with any complications or bleeding.  I will reappoint her for partial nail removal right hallux at her convenience.  Monitor for any signs or symptoms of infection in the meantime. -Monitor neuropathy symptoms.  Consider medications if needed.  Trula Slade DPM

## 2022-01-01 ENCOUNTER — Non-Acute Institutional Stay (SKILLED_NURSING_FACILITY): Payer: Medicare Other | Admitting: Adult Health

## 2022-01-01 ENCOUNTER — Encounter: Payer: Self-pay | Admitting: Adult Health

## 2022-01-01 DIAGNOSIS — G609 Hereditary and idiopathic neuropathy, unspecified: Secondary | ICD-10-CM

## 2022-01-01 DIAGNOSIS — Z4789 Encounter for other orthopedic aftercare: Secondary | ICD-10-CM | POA: Diagnosis not present

## 2022-01-01 DIAGNOSIS — Z9181 History of falling: Secondary | ICD-10-CM | POA: Diagnosis not present

## 2022-01-01 DIAGNOSIS — U071 COVID-19: Secondary | ICD-10-CM

## 2022-01-01 DIAGNOSIS — E890 Postprocedural hypothyroidism: Secondary | ICD-10-CM

## 2022-01-01 DIAGNOSIS — M84331S Stress fracture, right ulna, sequela: Secondary | ICD-10-CM | POA: Diagnosis not present

## 2022-01-01 DIAGNOSIS — K5901 Slow transit constipation: Secondary | ICD-10-CM

## 2022-01-01 DIAGNOSIS — S42401A Unspecified fracture of lower end of right humerus, initial encounter for closed fracture: Secondary | ICD-10-CM

## 2022-01-01 DIAGNOSIS — R600 Localized edema: Secondary | ICD-10-CM

## 2022-01-01 DIAGNOSIS — R2689 Other abnormalities of gait and mobility: Secondary | ICD-10-CM | POA: Diagnosis not present

## 2022-01-01 NOTE — Progress Notes (Signed)
Location:  Occupational psychologist of Service:  SNF (31) Provider:   Cindi Carbon, [place] [phone]   Shon Baton, MD  Patient Care Team: Shon Baton, MD as PCP - General (Internal Medicine) Minus Breeding, MD as PCP - Cardiology (Cardiology)  Extended Emergency Contact Information Primary Emergency Contact: [facility] Address: [address]          [place], [place] [address] [name] of [place] Phone: [phone] Relation: Relative Secondary Emergency Contact: [place] Phone: [phone] Relation: Daughter  Code Status:  Full Goals of care: Advanced Directive information    01/01/2022    4:19 PM  Advanced Directives  Does Patient Have a Medical Advance Directive? Yes  Type of Paramedic of [place];Living will  Copy of [place] in Chart? Yes - validated most recent copy scanned in chart (See row information)     Chief Complaint  Patient presents with   Elbow Injury    HPI:  Pt is a 81 y.o. female seen today for follow up after admission to rehab. She came from [place] and was admitted on 12/31/21.  She reports that she tripped at the curb in [place] fell and suffered an injury to her right elbow with fracture. She had an ORIF on 12/28/21.  She is now here in rehab for support and therapy. She is right handed. Ambulatory. Alert and oriented. She would like to discharge on Saturday. She contracted covid while in [place] from the tour group. She had some weakness and shaking otherwise no significant symptoms. This resolved and now she is off isolation. She is reporting constipation and tried senna with no benefit. Also has peripheral neuropathy. 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  Past Medical  History:  Diagnosis Date   Arthritis    Cancer ([place]) 1968   melanoma on right shin   Colon polyps    DDD (degenerative disc disease), cervical 03/29/2009   DG Cervical Spine Complete   Diverticulosis of sigmoid colon 01/2017   Edema    right lower leg/foot due to s/p melanoma excision   Gastritis    GERD (gastroesophageal reflux disease)    03-05-15 not an issue now   History of kidney stones    Hypothyroidism    IBS (irritable bowel syndrome)    Paresthesia    skin   Spondylosis 03/29/2009   DG Cervical Spine Complete   Thyroid disease    hypothyroid   Past Surgical History:  Procedure Laterality Date   ABDOMINAL HYSTERECTOMY  1979   CATARACT EXTRACTION, BILATERAL     8'15,. [place]  2007   posterior approach-no retained hardware   COLONOSCOPY     FOOT SURGERY Bilateral    hammertoe, neuromas, bunionectomy   MELANOMA EXCISION  1968   right shin/calf- calf measures 0.75 inches larger than left   THYROIDECTOMY, PARTIAL  2011   TONSILLECTOMY     TOTAL KNEE ARTHROPLASTY Left 03/19/2015   Procedure: LEFT TOTAL KNEE ARTHROPLASTY;  Surgeon: Paralee Cancel, MD;  Location: [facility];  Service: Orthopedics;  Laterality: Left;   TOTAL KNEE ARTHROPLASTY Right 04/27/2017   Procedure: RIGHT TOTAL KNEE ARTHROPLASTY;  Surgeon: Paralee Cancel, MD;  Location: [facility];  Service: Orthopedics;  Laterality: Right;  70 mins  Allergies  Allergen Reactions   Augmentin [Amoxicillin-Pot Clavulanate] Diarrhea and Nausea Only    Has patient had a PCN reaction causing immediate rash, facial/tongue/throat swelling, SOB or lightheadedness with hypotension: No Has patient had a PCN reaction causing severe rash involving mucus membranes or skin necrosis: No Has patient had a PCN reaction that required hospitalization No Has patient had a PCN reaction occurring within the last 10 years: No If all of the above answers are "NO", then may proceed with Cephalosporin use.    Erythromycin  Diarrhea and Nausea Only    Severe diarrhea    Outpatient Encounter Medications as of 01/01/2022  Medication Sig   acetaminophen (TYLENOL) 325 MG tablet Take 650 mg by mouth 3 (three) times daily as needed.   polyethylene glycol (MIRALAX / GLYCOLAX) 17 g packet Take 17 g by mouth daily as needed.   senna-docusate (SENNA S) 8.6-50 MG tablet Take 1 tablet by mouth 2 (two) times daily as needed for mild constipation.   cholecalciferol (VITAMIN D) 1000 UNITS tablet Take 1,000 Units by mouth daily.   levothyroxine (SYNTHROID, LEVOTHROID) 50 MCG tablet Take 50 mcg by mouth daily.    Polyethyl Glycol-Propyl Glycol (SYSTANE OP) Apply 1 drop to eye 2 (two) times daily as needed (dry eyes).   [DISCONTINUED] acetaminophen (TYLENOL) 500 MG tablet Take 2 tablets (1,000 mg total) by mouth every 6 (six) hours. (Patient taking differently: Take 1,000 mg by mouth every 6 (six) hours as needed.)   [DISCONTINUED] Magnesium Oxide 250 MG TABS 1 tablet as needed Orally Once a day   [DISCONTINUED] solifenacin (VESICARE) 5 MG tablet Take 5 mg by mouth daily.   No facility-administered encounter medications on file as of 01/01/2022.    Review of Systems  Constitutional:  Positive for appetite change. Negative for activity change, chills, diaphoresis, fatigue, fever and unexpected weight change.  HENT:  Negative for congestion.   Respiratory:  Negative for cough, shortness of breath and wheezing.   Cardiovascular:  Positive for leg swelling. Negative for chest pain and palpitations.  Gastrointestinal:  Positive for constipation. Negative for abdominal distention, abdominal pain and diarrhea.  Genitourinary:  Negative for difficulty urinating and dysuria.  Musculoskeletal:  Positive for arthralgias and joint swelling. Negative for back pain, gait problem and myalgias.  Neurological:  Positive for numbness. Negative for dizziness, tremors, seizures, syncope, facial asymmetry, speech difficulty, weakness,  light-headedness and headaches.  Psychiatric/Behavioral:  Negative for agitation, behavioral problems and confusion.     Immunization History  Administered Date(s) Administered   Influenza, High Dose Seasonal PF 02/13/2019   Moderna Covid-19 Vaccine Bivalent Booster 51yr & up 02/04/2021   Moderna Sars-Covid-2 Vaccination 05/02/2019, 06/27/2019   Pneumococcal Conjugate-13 01/09/2014   Zoster Recombinat (Shingrix) 06/29/2017   Pertinent  Health Maintenance Due  Topic Date Due   INFLUENZA VACCINE  11/18/2021   DEXA SCAN  Completed       No data to display         Functional Status Survey:    Vitals:   01/01/22 1616  BP: (!) 150/85  Pulse: 64  Resp: 16  Temp: 98.1 F (36.7 C)  SpO2: 96%  Weight: 128 lb (58.1 kg)   There is no height or weight on file to calculate BMI. Physical Exam Vitals and nursing note reviewed.  Constitutional:      General: She is not in acute distress.    Appearance: She is not diaphoretic.  HENT:     Head: Normocephalic and atraumatic.  Neck:  Vascular: No JVD.  Cardiovascular:     Rate and Rhythm: Normal rate and regular rhythm.     Heart sounds: No murmur heard. Pulmonary:     Effort: Pulmonary effort is normal. No respiratory distress.     Breath sounds: Normal breath sounds. No wheezing.  Abdominal:     General: Bowel sounds are normal. There is no distension.     Palpations: Abdomen is soft.     Tenderness: There is no abdominal tenderness.  Musculoskeletal:        General: Swelling (right hand) present.     Right lower leg: Edema (+1 chronic) present.     Left lower leg: No edema.     Comments: RUE +CMS Arm in sling and dressing.   Skin:    General: Skin is warm and dry.  Neurological:     Mental Status: She is alert and oriented to person, place, and time.     Labs reviewed: No results for input(s): "NA", "K", "CL", "CO2", "GLUCOSE", "BUN", "CREATININE", "CALCIUM", "MG", "PHOS" in the last 8760 hours. Recent Labs     11/17/21 0956  PROT 6.1   No results for input(s): "WBC", "NEUTROABS", "HGB", "HCT", "MCV", "PLT" in the last 8760 hours. Lab Results  Component Value Date   TSH 3.270 11/17/2021   Lab Results  Component Value Date   HGBA1C 5.3 11/17/2021   No results found for: "CHOL", "HDL", "LDLCALC", "LDLDIRECT", "TRIG", "CHOLHDL"  Significant Diagnostic Results in last 30 days:  VAS [place] LOWER EXTREMITY VENOUS (DVT)  Result Date: 10/09/2021  Lower Venous DVT Study Patient Name:  Morgan Gregory  Date of Exam:   10/09/2021 Medical Rec #: [id number]          Accession #:    [id number] Date of Birth: June 08, 1940          Patient Gender: F Patient Age:   68 years Exam Location:  [facility] Procedure:      VAS [place] LOWER EXTREMITY VENOUS (DVT) Referring Phys: Ria Comment LEE --------------------------------------------------------------------------------  Indications: Pain and swelling in the right lower extremity. Patient denies any SOB.  Risk Factors: Surgery to the right lower lateral leg; removal of a melanoma and lymph node removal at the right groin/upper thigh level greater than 15 years ago. Right total knee arthroplasty on 04/27/2017. Comparison Study: NA Performing Technologist: Sharlett Iles RVT  Examination Guidelines: A complete evaluation includes B-mode imaging, spectral Doppler, color Doppler, and power Doppler as needed of all accessible portions of each vessel. Bilateral testing is considered an integral part of a complete examination. Limited examinations for reoccurring indications may be performed as noted. The reflux portion of the exam is performed with the patient in reverse Trendelenburg.  +---------+---------------+---------+-----------+----------+--------------+ RIGHT    CompressibilityPhasicitySpontaneityPropertiesThrombus Aging +---------+---------------+---------+-----------+----------+--------------+ CFV      Full           Yes      Yes                                  +---------+---------------+---------+-----------+----------+--------------+ SFJ      Full           Yes      Yes                                 +---------+---------------+---------+-----------+----------+--------------+ FV Prox  Full  Yes      Yes                                 +---------+---------------+---------+-----------+----------+--------------+ FV Mid   Full                                                        +---------+---------------+---------+-----------+----------+--------------+ FV DistalFull           Yes      Yes                                 +---------+---------------+---------+-----------+----------+--------------+ PFV      Full           Yes      Yes                                 +---------+---------------+---------+-----------+----------+--------------+ POP      Full           Yes      Yes                                 +---------+---------------+---------+-----------+----------+--------------+ PTV      Full                                                        +---------+---------------+---------+-----------+----------+--------------+ PERO     Full                                                        +---------+---------------+---------+-----------+----------+--------------+ Gastroc  Full                                                        +---------+---------------+---------+-----------+----------+--------------+ GSV      Full                    No                                  +---------+---------------+---------+-----------+----------+--------------+ SSV      Full                    No                                  +---------+---------------+---------+-----------+----------+--------------+   +----+---------------+---------+-----------+----------+--------------+ LEFTCompressibilityPhasicitySpontaneityPropertiesThrombus Aging  +----+---------------+---------+-----------+----------+--------------+ CFV Full           Yes      Yes                                 +----+---------------+---------+-----------+----------+--------------+  Summary: RIGHT: - No evidence of deep vein thrombosis in the lower extremity. No indirect evidence of obstruction proximal to the inguinal ligament. - No cystic structure found in the popliteal fossa. - No evidence of venous insuffiencey involving the great or small saphenous veins.  LEFT: - No evidence of common femoral vein obstruction.  *See table(s) above for measurements and observations. Electronically signed by Deitra Mayo MD on 10/09/2021 at 3:56:55 PM.    Final     Assessment/Plan 1. Closed fracture of right elbow, initial encounter S/p ORIF in [place] Minimal records available. Wearing sling Will f/u with ortho May want to discharge on Saturday if ok with therapy Medically stable, alert oriented and able to make decisions for herself.   2. COVID-19 virus infection Mild ,resolved off isolation   3. Slow transit constipation  Senna s 1 bid prn Miralax 1 qd prn  4. Postoperative hypothyroidism On synthroid Managed by PCP   5. Localized edema Right lower ext Chronic after melanoma surgery unchanged.   6. Peripheral neuropathy Unclear etiology [facility]ed by [facility]  Family/ staff Communication: resident   Labs/tests ordered:  NA

## 2022-01-02 DIAGNOSIS — M25821 Other specified joint disorders, right elbow: Secondary | ICD-10-CM | POA: Diagnosis not present

## 2022-01-02 DIAGNOSIS — R2689 Other abnormalities of gait and mobility: Secondary | ICD-10-CM | POA: Diagnosis not present

## 2022-01-02 DIAGNOSIS — Z9181 History of falling: Secondary | ICD-10-CM | POA: Diagnosis not present

## 2022-01-02 DIAGNOSIS — M84331S Stress fracture, right ulna, sequela: Secondary | ICD-10-CM | POA: Diagnosis not present

## 2022-01-02 DIAGNOSIS — Z4789 Encounter for other orthopedic aftercare: Secondary | ICD-10-CM | POA: Diagnosis not present

## 2022-01-02 DIAGNOSIS — M25521 Pain in right elbow: Secondary | ICD-10-CM | POA: Diagnosis not present

## 2022-01-05 ENCOUNTER — Non-Acute Institutional Stay (SKILLED_NURSING_FACILITY): Payer: Medicare Other | Admitting: Internal Medicine

## 2022-01-05 ENCOUNTER — Ambulatory Visit: Payer: Medicare Other

## 2022-01-05 ENCOUNTER — Encounter: Payer: Self-pay | Admitting: Internal Medicine

## 2022-01-05 DIAGNOSIS — M84331S Stress fracture, right ulna, sequela: Secondary | ICD-10-CM | POA: Diagnosis not present

## 2022-01-05 DIAGNOSIS — R600 Localized edema: Secondary | ICD-10-CM

## 2022-01-05 DIAGNOSIS — Z9181 History of falling: Secondary | ICD-10-CM | POA: Diagnosis not present

## 2022-01-05 DIAGNOSIS — G609 Hereditary and idiopathic neuropathy, unspecified: Secondary | ICD-10-CM

## 2022-01-05 DIAGNOSIS — K5901 Slow transit constipation: Secondary | ICD-10-CM

## 2022-01-05 DIAGNOSIS — S42401S Unspecified fracture of lower end of right humerus, sequela: Secondary | ICD-10-CM

## 2022-01-05 DIAGNOSIS — U071 COVID-19: Secondary | ICD-10-CM

## 2022-01-05 DIAGNOSIS — E890 Postprocedural hypothyroidism: Secondary | ICD-10-CM | POA: Diagnosis not present

## 2022-01-05 DIAGNOSIS — M25521 Pain in right elbow: Secondary | ICD-10-CM | POA: Diagnosis not present

## 2022-01-05 DIAGNOSIS — S42401A Unspecified fracture of lower end of right humerus, initial encounter for closed fracture: Secondary | ICD-10-CM | POA: Diagnosis not present

## 2022-01-05 DIAGNOSIS — Z4789 Encounter for other orthopedic aftercare: Secondary | ICD-10-CM | POA: Diagnosis not present

## 2022-01-05 DIAGNOSIS — R2689 Other abnormalities of gait and mobility: Secondary | ICD-10-CM | POA: Diagnosis not present

## 2022-01-05 DIAGNOSIS — M25821 Other specified joint disorders, right elbow: Secondary | ICD-10-CM | POA: Diagnosis not present

## 2022-01-05 NOTE — Progress Notes (Unsigned)
Provider:   Location:  Occupational psychologist of Service:  SNF (31)  PCP: Shon Baton, MD Patient Care Team: Shon Baton, MD as PCP - General (Internal Medicine) Minus Breeding, MD as PCP - Cardiology (Cardiology)  Extended Emergency Contact Information Primary Emergency Contact: Gregory,Morgan Address: [address]          [place], [place] [address] [name] Phone: [phone] Relation: Relative Secondary Emergency Contact: [place] Phone: [phone] Relation: Daughter  Code Status:  Goals of Care: Advanced Directive information    01/01/2022    4:19 PM  Advanced Directives  Does Patient Have a Medical Advance Directive? Yes  Type of Paramedic of [place];Living will  Copy of [place] in Chart? Yes - validated most recent copy scanned in chart (See row information)      Chief Complaint  Patient presents with   New Admit To SNF    HPI: Patient is a 81 y.o. female seen today for admission to Rehab  Patient was in [place] with Group in [place]  When  she was diagnosed with Covid and UTI  and then she fell on the curb in the [place] There she was Diagnosed with Right Elbow fracture  Underwent surgery on 12/28/21  Was told by them that she cannot do anything with that arm for 2 weeks She has appointment with Ortho here next week She is now in Rehab for therapy and ADLS care  Patient  also having issues  with constipation and wants to know if she can get enema   She has a past medical  history of peripheral neuropathy, chronic right leg edema, bilateral knee replacement, hypothyroidism, IBS, DJD  Already walking but needing help with ADLS Lives in [place]  Was using hiking Stick before this fall Past Medical History:  Diagnosis Date   Arthritis    Cancer ([place]) 1968   melanoma on right shin   Colon polyps    DDD (degenerative disc disease), cervical 03/29/2009   DG  Cervical Spine Complete   Diverticulosis of sigmoid colon 01/2017   Edema    right lower leg/foot due to s/p melanoma excision   Gastritis    GERD (gastroesophageal reflux disease)    03-05-15 not an issue now   History of kidney stones    Hypothyroidism    IBS (irritable bowel syndrome)    Paresthesia    skin   Spondylosis 03/29/2009   DG Cervical Spine Complete   Thyroid disease    hypothyroid   Past Surgical History:  Procedure Laterality Date   ABDOMINAL HYSTERECTOMY  1979   CATARACT EXTRACTION, BILATERAL     8'15,. [place]  2007   posterior approach-no retained hardware   COLONOSCOPY     FOOT SURGERY Bilateral    hammertoe, neuromas, bunionectomy   MELANOMA EXCISION  1968   right shin/calf- calf measures 0.75 inches larger than left   THYROIDECTOMY, PARTIAL  2011   TONSILLECTOMY     TOTAL KNEE ARTHROPLASTY Left 03/19/2015   Procedure: LEFT TOTAL KNEE ARTHROPLASTY;  Surgeon: Paralee Cancel, MD;  Location: [facility];  Service: Orthopedics;  Laterality: Left;   TOTAL KNEE ARTHROPLASTY Right 04/27/2017   Procedure: RIGHT TOTAL KNEE ARTHROPLASTY;  Surgeon: Paralee Cancel, MD;  Location: [facility];  Service: Orthopedics;  Laterality: Right;  70 mins    reports that she quit smoking about 56 years ago. Her smoking use included cigarettes.  She started smoking about 63 years ago. She has a 4.50 pack-year smoking history. She has never used smokeless tobacco. She reports current alcohol use of about 10.0 standard drinks of alcohol per week. She reports that she does not use drugs. Social History   Socioeconomic History   Marital status: Widowed    Spouse name: Not on file   Number of children: Not on file   Years of education: Not on file   Highest education level: Not on file  Occupational History   Not on file  Tobacco Use   Smoking status: Former    Packs/day: 0.75    Years: 6.00    Total pack years: 4.50    Types: Cigarettes    Start date: 69    Quit  date: 03/04/1965    Years since quitting: 56.8   Smokeless tobacco: Never  Vaping Use   Vaping Use: Never used  Substance and Sexual Activity   Alcohol use: Yes    Alcohol/week: 10.0 standard drinks of alcohol    Types: 10 Glasses of wine per week    Comment: wine 1 glass daily   Drug use: No   Sexual activity: Not Currently  Other Topics Concern   Not on file  Social History Narrative   11/12/21 Lives alone, widow, [facility].   3 step children.     Social Determinants of Health   Financial Resource Strain: Not on file  Food Insecurity: Not on file  Transportation Needs: Not on file  Physical Activity: Not on file  Stress: Not on file  Social Connections: Not on file  Intimate Partner Violence: Not on file    Functional Status Survey:    Family History  Problem Relation Age of Onset   Colon cancer Mother 14   Emphysema Father    Heart attack Brother 39   Esophageal cancer Neg Hx    Rectal cancer Neg Hx    Stomach cancer Neg Hx     Health Maintenance  Topic Date Due   TETANUS/TDAP  Never done   Pneumonia Vaccine 79+ Years old (2 - PPSV23 or PCV20) 01/10/2015   Zoster Vaccines- Shingrix (2 of 2) 08/24/2017   COVID-19 Vaccine (4 - Moderna series) 06/07/2021   INFLUENZA VACCINE  11/18/2021   DEXA SCAN  Completed   HPV VACCINES  Aged Out    Allergies  Allergen Reactions   Augmentin [Amoxicillin-Pot Clavulanate] Diarrhea and Nausea Only    Has patient had a PCN reaction causing immediate rash, facial/tongue/throat swelling, SOB or lightheadedness with hypotension: No Has patient had a PCN reaction causing severe rash involving mucus membranes or skin necrosis: No Has patient had a PCN reaction that required hospitalization No Has patient had a PCN reaction occurring within the last 10 years: No If all of the above answers are "NO", then may proceed with Cephalosporin use.    Erythromycin Diarrhea and Nausea Only    Severe diarrhea    Outpatient Encounter  Medications as of 01/05/2022  Medication Sig   acetaminophen (TYLENOL) 325 MG tablet Take 650 mg by mouth 3 (three) times daily as needed.   cholecalciferol (VITAMIN D) 1000 UNITS tablet Take 1,000 Units by mouth daily.   levothyroxine (SYNTHROID, LEVOTHROID) 50 MCG tablet Take 50 mcg by mouth daily.    Polyethyl Glycol-Propyl Glycol (SYSTANE OP) Apply 1 drop to eye 2 (two) times daily as needed (dry eyes).   polyethylene glycol (MIRALAX / GLYCOLAX) 17 g packet Take 17 g by mouth daily  as needed.   senna-docusate (SENNA S) 8.6-50 MG tablet Take 1 tablet by mouth 2 (two) times daily as needed for mild constipation.   No facility-administered encounter medications on file as of 01/05/2022.    Review of Systems  Constitutional:  Negative for activity change and appetite change.  HENT: Negative.    Respiratory:  Negative for cough and shortness of breath.   Cardiovascular:  Negative for leg swelling.  Gastrointestinal:  Positive for constipation.  Genitourinary: Negative.   Musculoskeletal:  Positive for gait problem. Negative for arthralgias and myalgias.  Skin: Negative.   Neurological:  Negative for dizziness and weakness.  Psychiatric/Behavioral:  Negative for confusion, dysphoric mood and sleep disturbance.     Vitals:   01/05/22 1229  BP: 129/71  Pulse: 67  Resp: 19  Temp: (!) 97.3 F (36.3 C)  Weight: 128 lb (58.1 kg)   Body mass index is 22.67 kg/m. Physical Exam Vitals reviewed.  Constitutional:      Appearance: Normal appearance.  HENT:     Head: Normocephalic.     Nose: Nose normal.     Mouth/Throat:     Mouth: Mucous membranes are moist.     Pharynx: Oropharynx is clear.  Eyes:     Pupils: Pupils are equal, round, and reactive to light.  Cardiovascular:     Rate and Rhythm: Normal rate and regular rhythm.     Pulses: Normal pulses.     Heart sounds: Normal heart sounds. No murmur heard. Pulmonary:     Effort: Pulmonary effort is normal.     Breath sounds:  Normal breath sounds.  Abdominal:     General: Abdomen is flat. Bowel sounds are normal.     Palpations: Abdomen is soft.  Musculoskeletal:     Cervical back: Neck supple.     Comments: Right Arm in Cast and Sling Fingers no swelling No Pain Right leg Swelling present  Skin:    General: Skin is warm.  Neurological:     General: No focal deficit present.     Mental Status: She is alert and oriented to person, place, and time.  Psychiatric:        Mood and Affect: Mood normal.        Thought Content: Thought content normal.     Labs reviewed: Basic Metabolic Panel: No results for input(s): "NA", "K", "CL", "CO2", "GLUCOSE", "BUN", "CREATININE", "CALCIUM", "MG", "PHOS" in the last 8760 hours. Liver Function Tests: Recent Labs    11/17/21 0956  PROT 6.1   No results for input(s): "LIPASE", "AMYLASE" in the last 8760 hours. No results for input(s): "AMMONIA" in the last 8760 hours. CBC: No results for input(s): "WBC", "NEUTROABS", "HGB", "HCT", "MCV", "PLT" in the last 8760 hours. Cardiac Enzymes: No results for input(s): "CKTOTAL", "CKMB", "CKMBINDEX", "TROPONINI" in the last 8760 hours. BNP: Invalid input(s): "POCBNP" Lab Results  Component Value Date   HGBA1C 5.3 11/17/2021   Lab Results  Component Value Date   TSH 3.270 11/17/2021   No results found for: "VITAMINB12" No results found for: "FOLATE" No results found for: "IRON", "TIBC", "FERRITIN"  Imaging and Procedures obtained prior to SNF admission: VAS [place] LOWER EXTREMITY VENOUS (DVT)  Result Date: 10/09/2021  Lower Venous DVT Study Patient Name:  Morgan Gregory  Date of Exam:   10/09/2021 Medical Rec #: [id number]          Accession #:    [id number] Date of Birth: 06-22-1940  Patient Gender: F Patient Age:   64 years Exam Location:  [facility] Procedure:      VAS [place] LOWER EXTREMITY VENOUS (DVT) Referring Phys: LINDSAY LEE  --------------------------------------------------------------------------------  Indications: Pain and swelling in the right lower extremity. Patient denies any SOB.  Risk Factors: Surgery to the right lower lateral leg; removal of a melanoma and lymph node removal at the right groin/upper thigh level greater than 15 years ago. Right total knee arthroplasty on 04/27/2017. Comparison Study: NA Performing Technologist: Sharlett Iles RVT  Examination Guidelines: A complete evaluation includes B-mode imaging, spectral Doppler, color Doppler, and power Doppler as needed of all accessible portions of each vessel. Bilateral testing is considered an integral part of a complete examination. Limited examinations for reoccurring indications may be performed as noted. The reflux portion of the exam is performed with the patient in reverse Trendelenburg.  +---------+---------------+---------+-----------+----------+--------------+ RIGHT    CompressibilityPhasicitySpontaneityPropertiesThrombus Aging +---------+---------------+---------+-----------+----------+--------------+ CFV      Full           Yes      Yes                                 +---------+---------------+---------+-----------+----------+--------------+ SFJ      Full           Yes      Yes                                 +---------+---------------+---------+-----------+----------+--------------+ FV Prox  Full           Yes      Yes                                 +---------+---------------+---------+-----------+----------+--------------+ FV Mid   Full                                                        +---------+---------------+---------+-----------+----------+--------------+ FV DistalFull           Yes      Yes                                 +---------+---------------+---------+-----------+----------+--------------+ PFV      Full           Yes      Yes                                  +---------+---------------+---------+-----------+----------+--------------+ POP      Full           Yes      Yes                                 +---------+---------------+---------+-----------+----------+--------------+ PTV      Full                                                        +---------+---------------+---------+-----------+----------+--------------+  PERO     Full                                                        +---------+---------------+---------+-----------+----------+--------------+ Gastroc  Full                                                        +---------+---------------+---------+-----------+----------+--------------+ GSV      Full                    No                                  +---------+---------------+---------+-----------+----------+--------------+ SSV      Full                    No                                  +---------+---------------+---------+-----------+----------+--------------+   +----+---------------+---------+-----------+----------+--------------+ LEFTCompressibilityPhasicitySpontaneityPropertiesThrombus Aging +----+---------------+---------+-----------+----------+--------------+ CFV Full           Yes      Yes                                 +----+---------------+---------+-----------+----------+--------------+     Summary: RIGHT: - No evidence of deep vein thrombosis in the lower extremity. No indirect evidence of obstruction proximal to the inguinal ligament. - No cystic structure found in the popliteal fossa. - No evidence of venous insuffiencey involving the great or small saphenous veins.  LEFT: - No evidence of common femoral vein obstruction.  *See table(s) above for measurements and observations. Electronically signed by Deitra Mayo MD on 10/09/2021 at 3:56:55 PM.    Final     Assessment/Plan 1. Closed fracture of right elbow, sequela Wants to wait to see Ortho next week Already  walking  Still needs help with her ADLS Pain Controlled with Tylenol  2. Postoperative hypothyroidism Follows with her PCP  3. Localized edema Uses Compression Stockings  4. Idiopathic peripheral neuropathy Follows with Dr Leta Baptist  All her labs were normal  5. Slow transit constipation Wants to get Enema   6. COVID-19 virus infection Off Isolation    Family/ staff Communication:   Labs/tests ordered: OHY,WVP

## 2022-01-06 DIAGNOSIS — Z9181 History of falling: Secondary | ICD-10-CM | POA: Diagnosis not present

## 2022-01-06 DIAGNOSIS — R2689 Other abnormalities of gait and mobility: Secondary | ICD-10-CM | POA: Diagnosis not present

## 2022-01-06 DIAGNOSIS — M25821 Other specified joint disorders, right elbow: Secondary | ICD-10-CM | POA: Diagnosis not present

## 2022-01-06 DIAGNOSIS — M84331S Stress fracture, right ulna, sequela: Secondary | ICD-10-CM | POA: Diagnosis not present

## 2022-01-06 DIAGNOSIS — M25521 Pain in right elbow: Secondary | ICD-10-CM | POA: Diagnosis not present

## 2022-01-06 DIAGNOSIS — Z4789 Encounter for other orthopedic aftercare: Secondary | ICD-10-CM | POA: Diagnosis not present

## 2022-01-06 LAB — HEPATIC FUNCTION PANEL
ALT: 19 U/L (ref 7–35)
AST: 28 (ref 13–35)
Alkaline Phosphatase: 49 (ref 25–125)
Bilirubin, Total: 0.4

## 2022-01-06 LAB — COMPREHENSIVE METABOLIC PANEL
Albumin: 3.4 — AB (ref 3.5–5.0)
Calcium: 8.3 — AB (ref 8.7–10.7)
eGFR: >90

## 2022-01-06 LAB — BASIC METABOLIC PANEL
BUN: 13 (ref 4–21)
CO2: 24 — AB (ref 13–22)
Chloride: 101 (ref 99–108)
Creatinine: 0.6 (ref 0.5–1.1)
Glucose: 92
Potassium: 4 mEq/L (ref 3.5–5.1)
Sodium: 136 — AB (ref 137–147)

## 2022-01-06 LAB — CBC AND DIFFERENTIAL
HCT: 37 (ref 36–46)
Hemoglobin: 12.9 (ref 12.0–16.0)
Platelets: 291 10*3/uL (ref 150–400)
WBC: 3

## 2022-01-06 LAB — CBC: RBC: 4.1 (ref 3.87–5.11)

## 2022-01-07 ENCOUNTER — Encounter: Payer: Self-pay | Admitting: Internal Medicine

## 2022-01-07 DIAGNOSIS — M84331S Stress fracture, right ulna, sequela: Secondary | ICD-10-CM | POA: Diagnosis not present

## 2022-01-07 DIAGNOSIS — Z9181 History of falling: Secondary | ICD-10-CM | POA: Diagnosis not present

## 2022-01-07 DIAGNOSIS — Z4789 Encounter for other orthopedic aftercare: Secondary | ICD-10-CM | POA: Diagnosis not present

## 2022-01-07 DIAGNOSIS — R2689 Other abnormalities of gait and mobility: Secondary | ICD-10-CM | POA: Diagnosis not present

## 2022-01-07 DIAGNOSIS — M25821 Other specified joint disorders, right elbow: Secondary | ICD-10-CM | POA: Diagnosis not present

## 2022-01-07 DIAGNOSIS — M25521 Pain in right elbow: Secondary | ICD-10-CM | POA: Diagnosis not present

## 2022-01-08 DIAGNOSIS — Z9181 History of falling: Secondary | ICD-10-CM | POA: Diagnosis not present

## 2022-01-08 DIAGNOSIS — M25821 Other specified joint disorders, right elbow: Secondary | ICD-10-CM | POA: Diagnosis not present

## 2022-01-08 DIAGNOSIS — Z4789 Encounter for other orthopedic aftercare: Secondary | ICD-10-CM | POA: Diagnosis not present

## 2022-01-08 DIAGNOSIS — R2689 Other abnormalities of gait and mobility: Secondary | ICD-10-CM | POA: Diagnosis not present

## 2022-01-08 DIAGNOSIS — M84331S Stress fracture, right ulna, sequela: Secondary | ICD-10-CM | POA: Diagnosis not present

## 2022-01-08 DIAGNOSIS — M25521 Pain in right elbow: Secondary | ICD-10-CM | POA: Diagnosis not present

## 2022-01-09 DIAGNOSIS — R4189 Other symptoms and signs involving cognitive functions and awareness: Secondary | ICD-10-CM | POA: Diagnosis not present

## 2022-01-09 DIAGNOSIS — M25821 Other specified joint disorders, right elbow: Secondary | ICD-10-CM | POA: Diagnosis not present

## 2022-01-09 DIAGNOSIS — Z4789 Encounter for other orthopedic aftercare: Secondary | ICD-10-CM | POA: Diagnosis not present

## 2022-01-09 DIAGNOSIS — R41841 Cognitive communication deficit: Secondary | ICD-10-CM | POA: Diagnosis not present

## 2022-01-09 DIAGNOSIS — M84331S Stress fracture, right ulna, sequela: Secondary | ICD-10-CM | POA: Diagnosis not present

## 2022-01-09 DIAGNOSIS — R2689 Other abnormalities of gait and mobility: Secondary | ICD-10-CM | POA: Diagnosis not present

## 2022-01-09 DIAGNOSIS — M25521 Pain in right elbow: Secondary | ICD-10-CM | POA: Diagnosis not present

## 2022-01-09 DIAGNOSIS — Z9181 History of falling: Secondary | ICD-10-CM | POA: Diagnosis not present

## 2022-01-12 DIAGNOSIS — Z4789 Encounter for other orthopedic aftercare: Secondary | ICD-10-CM | POA: Diagnosis not present

## 2022-01-12 DIAGNOSIS — M25821 Other specified joint disorders, right elbow: Secondary | ICD-10-CM | POA: Diagnosis not present

## 2022-01-12 DIAGNOSIS — M25521 Pain in right elbow: Secondary | ICD-10-CM | POA: Diagnosis not present

## 2022-01-12 DIAGNOSIS — S52021A Displaced fracture of olecranon process without intraarticular extension of right ulna, initial encounter for closed fracture: Secondary | ICD-10-CM | POA: Diagnosis not present

## 2022-01-12 DIAGNOSIS — Z9181 History of falling: Secondary | ICD-10-CM | POA: Diagnosis not present

## 2022-01-13 DIAGNOSIS — M84331S Stress fracture, right ulna, sequela: Secondary | ICD-10-CM | POA: Diagnosis not present

## 2022-01-13 DIAGNOSIS — R2689 Other abnormalities of gait and mobility: Secondary | ICD-10-CM | POA: Diagnosis not present

## 2022-01-13 DIAGNOSIS — Z4789 Encounter for other orthopedic aftercare: Secondary | ICD-10-CM | POA: Diagnosis not present

## 2022-01-13 DIAGNOSIS — S52023A Displaced fracture of olecranon process without intraarticular extension of unspecified ulna, initial encounter for closed fracture: Secondary | ICD-10-CM | POA: Insufficient documentation

## 2022-01-13 DIAGNOSIS — M25521 Pain in right elbow: Secondary | ICD-10-CM | POA: Diagnosis not present

## 2022-01-13 DIAGNOSIS — M25821 Other specified joint disorders, right elbow: Secondary | ICD-10-CM | POA: Diagnosis not present

## 2022-01-13 DIAGNOSIS — Z9181 History of falling: Secondary | ICD-10-CM | POA: Diagnosis not present

## 2022-01-14 ENCOUNTER — Non-Acute Institutional Stay (SKILLED_NURSING_FACILITY): Payer: Medicare Other | Admitting: Orthopedic Surgery

## 2022-01-14 ENCOUNTER — Encounter: Payer: Self-pay | Admitting: Orthopedic Surgery

## 2022-01-14 DIAGNOSIS — R058 Other specified cough: Secondary | ICD-10-CM

## 2022-01-14 DIAGNOSIS — R059 Cough, unspecified: Secondary | ICD-10-CM | POA: Diagnosis not present

## 2022-01-14 DIAGNOSIS — R2689 Other abnormalities of gait and mobility: Secondary | ICD-10-CM | POA: Diagnosis not present

## 2022-01-14 DIAGNOSIS — Z9181 History of falling: Secondary | ICD-10-CM | POA: Diagnosis not present

## 2022-01-14 DIAGNOSIS — Z4789 Encounter for other orthopedic aftercare: Secondary | ICD-10-CM | POA: Diagnosis not present

## 2022-01-14 DIAGNOSIS — S42401S Unspecified fracture of lower end of right humerus, sequela: Secondary | ICD-10-CM

## 2022-01-14 DIAGNOSIS — M84331S Stress fracture, right ulna, sequela: Secondary | ICD-10-CM | POA: Diagnosis not present

## 2022-01-14 NOTE — Progress Notes (Signed)
Location:  [facility] Room Number: 151/A Place of Service:  SNF [id number]) Provider:  Yvonna Alanis, NP   Shon Baton, MD  Patient Care Team: Shon Baton, MD as PCP - General (Internal Medicine) Minus Breeding, MD as PCP - Cardiology (Cardiology)  Extended Emergency Contact Information Primary Emergency Contact: Wooley,Ann Address: [address]          [place], [place] [address] [name] of [place] Phone: [phone] Relation: Relative Secondary Emergency Contact: [place] Phone: [phone] Relation: Daughter  Code Status:  Full code Goals of care: Advanced Directive information    01/01/2022    4:19 PM  Advanced Directives  Does Patient Have a Medical Advance Directive? Yes  Type of Paramedic of [place];Living will  Copy of [place] in Chart? Yes - validated most recent copy scanned in chart (See row information)     Chief Complaint  Patient presents with   Acute Visit    Productive cough     HPI:  Pt is a 81 y.o. female seen today for acute visit due to productive cough.   She currently resides on the rehabilitation unit at [facility] due to right elbow fracture.   09/10 she had ORIF in [place] due to fall. She contracted covid while in [place]. Reports mild symptoms. She did not take paxlovid. She was taken isolation per [facility] protocol 09/14. Today, she reports lingering productive cough. Sputum tan. She does not have any difficulty coughing up phelgm. Denies chest pain, sob, fatigue, nasal congestion or sore throat. O2 sat> 90% on room air. She continues to work with PT/OT without difficulty. Eating and drinking well.   Scheduled to have revision with Dr. Amedeo Plenty 10/03. Reports mild pain to right elbow. Tylenol effective with pain control. She is wearing sling to RUE at all times except showering.   Past Medical History:  Diagnosis Date   Arthritis     Cancer ([place]) 1968   melanoma on right shin   Colon polyps    DDD (degenerative disc disease), cervical 03/29/2009   DG Cervical Spine Complete   Diverticulosis of sigmoid colon 01/2017   Edema    right lower leg/foot due to s/p melanoma excision   Gastritis    GERD (gastroesophageal reflux disease)    03-05-15 not an issue now   History of kidney stones    Hypothyroidism    IBS (irritable bowel syndrome)    Paresthesia    skin   Spondylosis 03/29/2009   DG Cervical Spine Complete   Thyroid disease    hypothyroid   Past Surgical History:  Procedure Laterality Date   ABDOMINAL HYSTERECTOMY  1979   CATARACT EXTRACTION, BILATERAL     8'15,. [place]  2007   posterior approach-no retained hardware   COLONOSCOPY     FOOT SURGERY Bilateral    hammertoe, neuromas, bunionectomy   MELANOMA EXCISION  1968   right shin/calf- calf measures 0.75 inches larger than left   THYROIDECTOMY, PARTIAL  2011   TONSILLECTOMY     TOTAL KNEE ARTHROPLASTY Left 03/19/2015   Procedure: LEFT TOTAL KNEE ARTHROPLASTY;  Surgeon: Paralee Cancel, MD;  Location: [facility];  Service: Orthopedics;  Laterality: Left;   TOTAL KNEE ARTHROPLASTY Right 04/27/2017   Procedure: RIGHT TOTAL KNEE ARTHROPLASTY;  Surgeon: Paralee Cancel, MD;  Location: [facility];  Service: Orthopedics;  Laterality: Right;  70 mins    Allergies  Allergen Reactions   Augmentin [  Amoxicillin-Pot Clavulanate] Diarrhea and Nausea Only    Has patient had a PCN reaction causing immediate rash, facial/tongue/throat swelling, SOB or lightheadedness with hypotension: No Has patient had a PCN reaction causing severe rash involving mucus membranes or skin necrosis: No Has patient had a PCN reaction that required hospitalization No Has patient had a PCN reaction occurring within the last 10 years: No If all of the above answers are "NO", then may proceed with Cephalosporin use.    Erythromycin Diarrhea and Nausea Only    Severe  diarrhea    Outpatient Encounter Medications as of 01/14/2022  Medication Sig   acetaminophen (TYLENOL) 325 MG tablet Take 650 mg by mouth 3 (three) times daily as needed.   cholecalciferol (VITAMIN D) 1000 UNITS tablet Take 1,000 Units by mouth daily.   levothyroxine (SYNTHROID, LEVOTHROID) 50 MCG tablet Take 50 mcg by mouth daily.    Polyethyl Glycol-Propyl Glycol (SYSTANE OP) Apply 1 drop to eye 2 (two) times daily as needed (dry eyes).   polyethylene glycol (MIRALAX / GLYCOLAX) 17 g packet Take 17 g by mouth daily as needed.   senna-docusate (SENNA S) 8.6-50 MG tablet Take 1 tablet by mouth 2 (two) times daily as needed for mild constipation.   No facility-administered encounter medications on file as of 01/14/2022.    Review of Systems  Constitutional:  Negative for activity change, appetite change, fatigue and fever.  HENT:  Negative for congestion and sore throat.   Respiratory:  Positive for cough. Negative for chest tightness, shortness of breath and wheezing.   Cardiovascular:  Negative for chest pain and leg swelling.  Gastrointestinal:  Negative for constipation and nausea.  Genitourinary:  Negative for dysuria.  Musculoskeletal:  Positive for gait problem and joint swelling. Negative for arthralgias and myalgias.  Skin:  Positive for wound.  Neurological:  Positive for weakness. Negative for dizziness and headaches.  Psychiatric/Behavioral:  Negative for confusion and dysphoric mood. The patient is not nervous/anxious.     Immunization History  Administered Date(s) Administered   Influenza, High Dose Seasonal PF 02/13/2019   Moderna Covid-19 Vaccine Bivalent Booster 44yr & up 02/04/2021   Moderna Sars-Covid-2 Vaccination 05/02/2019, 06/27/2019   Pneumococcal Conjugate-13 01/09/2014   Zoster Recombinat (Shingrix) 06/29/2017   Pertinent  Health Maintenance Due  Topic Date Due   INFLUENZA VACCINE  11/18/2021   DEXA SCAN  Completed       No data to display          Functional Status Survey:    Vitals:   01/14/22 1628  BP: 130/75  Pulse: (!) 56  Resp: 14  Temp: 98.1 F (36.7 C)  SpO2: 94%  Weight: 127 lb 6.4 oz (57.8 kg)  Height: '5\' 3"'$  (1.6 m)   Body mass index is 22.57 kg/m. Physical Exam Vitals reviewed.  Constitutional:      General: She is not in acute distress. HENT:     Head: Normocephalic.  Eyes:     General:        Right eye: No discharge.        Left eye: No discharge.  Cardiovascular:     Rate and Rhythm: Normal rate and regular rhythm.     Pulses: Normal pulses.     Heart sounds: Normal heart sounds.  Pulmonary:     Effort: Pulmonary effort is normal. No respiratory distress.     Breath sounds: Examination of the right-middle field reveals decreased breath sounds. Examination of the right-lower field reveals decreased breath sounds.  Decreased breath sounds present. No wheezing or rhonchi.  Abdominal:     General: Bowel sounds are normal. There is no distension.     Palpations: Abdomen is soft.     Tenderness: There is no abdominal tenderness.  Musculoskeletal:     Right elbow: Swelling present. No deformity. Decreased range of motion. No tenderness.     Cervical back: Neck supple.     Thoracic back: Scoliosis present.     Right lower leg: No edema.     Left lower leg: No edema.     Comments: RUE in sling, FROM to wrist, mild swelling to right hand, cap refill < 2 sec, radial pulse 2+  Skin:    General: Skin is warm and dry.     Capillary Refill: Capillary refill takes less than 2 seconds.     Comments: Right elbow incision CDI  Neurological:     General: No focal deficit present.     Mental Status: She is alert and oriented to person, place, and time.     Gait: Gait abnormal.     Comments: Cane   Psychiatric:        Mood and Affect: Mood normal.        Behavior: Behavior normal.     Labs reviewed: Recent Labs    01/06/22 0000  NA 136*  K 4.0  CL 101  CO2 24*  BUN 13  CREATININE 0.6  CALCIUM  8.3*   Recent Labs    11/17/21 0956 01/06/22 0000  AST  --  28  ALT  --  19  ALKPHOS  --  49  PROT 6.1  --   ALBUMIN  --  3.4*   Recent Labs    01/06/22 0000  WBC 3.0  HGB 12.9  HCT 37  PLT 291   Lab Results  Component Value Date   TSH 3.270 11/17/2021   Lab Results  Component Value Date   HGBA1C 5.3 11/17/2021   No results found for: "CHOL", "HDL", "LDLCALC", "LDLDIRECT", "TRIG", "CHOLHDL"  Significant Diagnostic Results in last 30 days:  No results found.  Assessment/Plan 1. Productive cough - covid positive while in [place]- off precautions 09/14 - was not given paxlovid - slightly diminished lung sounds to right base- has scoliosis - CXR to r/o PNA  2. Closed fracture of right elbow, sequela - due to fall, ORIF 09/10 in [place] - revision scheduled 10/03 with Dr. Amedeo Plenty - cont tylenol  - cont PT/OT    Family/ staff Communication: plan discussed with patient and nurse  Labs/tests ordered:  CXR

## 2022-01-15 DIAGNOSIS — Z4789 Encounter for other orthopedic aftercare: Secondary | ICD-10-CM | POA: Diagnosis not present

## 2022-01-15 DIAGNOSIS — R2689 Other abnormalities of gait and mobility: Secondary | ICD-10-CM | POA: Diagnosis not present

## 2022-01-15 DIAGNOSIS — Z9181 History of falling: Secondary | ICD-10-CM | POA: Diagnosis not present

## 2022-01-15 DIAGNOSIS — M84331S Stress fracture, right ulna, sequela: Secondary | ICD-10-CM | POA: Diagnosis not present

## 2022-01-16 DIAGNOSIS — Z4789 Encounter for other orthopedic aftercare: Secondary | ICD-10-CM | POA: Diagnosis not present

## 2022-01-16 DIAGNOSIS — Z9181 History of falling: Secondary | ICD-10-CM | POA: Diagnosis not present

## 2022-01-16 DIAGNOSIS — R2689 Other abnormalities of gait and mobility: Secondary | ICD-10-CM | POA: Diagnosis not present

## 2022-01-16 DIAGNOSIS — M84331S Stress fracture, right ulna, sequela: Secondary | ICD-10-CM | POA: Diagnosis not present

## 2022-01-20 DIAGNOSIS — S46311A Strain of muscle, fascia and tendon of triceps, right arm, initial encounter: Secondary | ICD-10-CM | POA: Diagnosis not present

## 2022-01-20 DIAGNOSIS — S52021A Displaced fracture of olecranon process without intraarticular extension of right ulna, initial encounter for closed fracture: Secondary | ICD-10-CM | POA: Diagnosis not present

## 2022-01-20 DIAGNOSIS — M24521 Contracture, right elbow: Secondary | ICD-10-CM | POA: Diagnosis not present

## 2022-01-20 DIAGNOSIS — T8489XA Other specified complication of internal orthopedic prosthetic devices, implants and grafts, initial encounter: Secondary | ICD-10-CM | POA: Diagnosis not present

## 2022-01-23 DIAGNOSIS — M25521 Pain in right elbow: Secondary | ICD-10-CM | POA: Diagnosis not present

## 2022-01-23 DIAGNOSIS — M84331S Stress fracture, right ulna, sequela: Secondary | ICD-10-CM | POA: Diagnosis not present

## 2022-01-23 DIAGNOSIS — R2689 Other abnormalities of gait and mobility: Secondary | ICD-10-CM | POA: Diagnosis not present

## 2022-01-23 DIAGNOSIS — M25821 Other specified joint disorders, right elbow: Secondary | ICD-10-CM | POA: Diagnosis not present

## 2022-01-23 DIAGNOSIS — Z4789 Encounter for other orthopedic aftercare: Secondary | ICD-10-CM | POA: Diagnosis not present

## 2022-01-23 DIAGNOSIS — Z9181 History of falling: Secondary | ICD-10-CM | POA: Diagnosis not present

## 2022-01-25 DIAGNOSIS — R059 Cough, unspecified: Secondary | ICD-10-CM | POA: Diagnosis not present

## 2022-01-26 DIAGNOSIS — R2689 Other abnormalities of gait and mobility: Secondary | ICD-10-CM | POA: Diagnosis not present

## 2022-01-26 DIAGNOSIS — M25821 Other specified joint disorders, right elbow: Secondary | ICD-10-CM | POA: Diagnosis not present

## 2022-01-26 DIAGNOSIS — Z9181 History of falling: Secondary | ICD-10-CM | POA: Diagnosis not present

## 2022-01-26 DIAGNOSIS — Z4789 Encounter for other orthopedic aftercare: Secondary | ICD-10-CM | POA: Diagnosis not present

## 2022-01-26 DIAGNOSIS — M84331S Stress fracture, right ulna, sequela: Secondary | ICD-10-CM | POA: Diagnosis not present

## 2022-01-26 DIAGNOSIS — M25521 Pain in right elbow: Secondary | ICD-10-CM | POA: Diagnosis not present

## 2022-01-27 DIAGNOSIS — R2689 Other abnormalities of gait and mobility: Secondary | ICD-10-CM | POA: Diagnosis not present

## 2022-01-27 DIAGNOSIS — Z4789 Encounter for other orthopedic aftercare: Secondary | ICD-10-CM | POA: Diagnosis not present

## 2022-01-27 DIAGNOSIS — M25521 Pain in right elbow: Secondary | ICD-10-CM | POA: Diagnosis not present

## 2022-01-27 DIAGNOSIS — M25821 Other specified joint disorders, right elbow: Secondary | ICD-10-CM | POA: Diagnosis not present

## 2022-01-27 DIAGNOSIS — Z9181 History of falling: Secondary | ICD-10-CM | POA: Diagnosis not present

## 2022-01-27 DIAGNOSIS — M84331S Stress fracture, right ulna, sequela: Secondary | ICD-10-CM | POA: Diagnosis not present

## 2022-01-28 DIAGNOSIS — Z9181 History of falling: Secondary | ICD-10-CM | POA: Diagnosis not present

## 2022-01-28 DIAGNOSIS — Z4789 Encounter for other orthopedic aftercare: Secondary | ICD-10-CM | POA: Diagnosis not present

## 2022-01-28 DIAGNOSIS — R2689 Other abnormalities of gait and mobility: Secondary | ICD-10-CM | POA: Diagnosis not present

## 2022-01-28 DIAGNOSIS — M84331S Stress fracture, right ulna, sequela: Secondary | ICD-10-CM | POA: Diagnosis not present

## 2022-01-28 DIAGNOSIS — R41841 Cognitive communication deficit: Secondary | ICD-10-CM | POA: Diagnosis not present

## 2022-01-28 DIAGNOSIS — R4189 Other symptoms and signs involving cognitive functions and awareness: Secondary | ICD-10-CM | POA: Diagnosis not present

## 2022-01-28 DIAGNOSIS — M25521 Pain in right elbow: Secondary | ICD-10-CM | POA: Diagnosis not present

## 2022-01-28 DIAGNOSIS — M25821 Other specified joint disorders, right elbow: Secondary | ICD-10-CM | POA: Diagnosis not present

## 2022-01-29 DIAGNOSIS — M25821 Other specified joint disorders, right elbow: Secondary | ICD-10-CM | POA: Diagnosis not present

## 2022-01-29 DIAGNOSIS — M25521 Pain in right elbow: Secondary | ICD-10-CM | POA: Diagnosis not present

## 2022-01-29 DIAGNOSIS — R2689 Other abnormalities of gait and mobility: Secondary | ICD-10-CM | POA: Diagnosis not present

## 2022-01-29 DIAGNOSIS — M84331S Stress fracture, right ulna, sequela: Secondary | ICD-10-CM | POA: Diagnosis not present

## 2022-01-29 DIAGNOSIS — Z9181 History of falling: Secondary | ICD-10-CM | POA: Diagnosis not present

## 2022-01-29 DIAGNOSIS — Z4789 Encounter for other orthopedic aftercare: Secondary | ICD-10-CM | POA: Diagnosis not present

## 2022-01-30 DIAGNOSIS — Z9181 History of falling: Secondary | ICD-10-CM | POA: Diagnosis not present

## 2022-01-30 DIAGNOSIS — M84331S Stress fracture, right ulna, sequela: Secondary | ICD-10-CM | POA: Diagnosis not present

## 2022-01-30 DIAGNOSIS — M25521 Pain in right elbow: Secondary | ICD-10-CM | POA: Diagnosis not present

## 2022-01-30 DIAGNOSIS — Z4789 Encounter for other orthopedic aftercare: Secondary | ICD-10-CM | POA: Diagnosis not present

## 2022-01-30 DIAGNOSIS — R2689 Other abnormalities of gait and mobility: Secondary | ICD-10-CM | POA: Diagnosis not present

## 2022-01-30 DIAGNOSIS — M25821 Other specified joint disorders, right elbow: Secondary | ICD-10-CM | POA: Diagnosis not present

## 2022-02-02 DIAGNOSIS — R2689 Other abnormalities of gait and mobility: Secondary | ICD-10-CM | POA: Diagnosis not present

## 2022-02-02 DIAGNOSIS — M25821 Other specified joint disorders, right elbow: Secondary | ICD-10-CM | POA: Diagnosis not present

## 2022-02-02 DIAGNOSIS — Z9181 History of falling: Secondary | ICD-10-CM | POA: Diagnosis not present

## 2022-02-02 DIAGNOSIS — Z4789 Encounter for other orthopedic aftercare: Secondary | ICD-10-CM | POA: Diagnosis not present

## 2022-02-02 DIAGNOSIS — M84331S Stress fracture, right ulna, sequela: Secondary | ICD-10-CM | POA: Diagnosis not present

## 2022-02-02 DIAGNOSIS — M25521 Pain in right elbow: Secondary | ICD-10-CM | POA: Diagnosis not present

## 2022-02-03 DIAGNOSIS — Z4789 Encounter for other orthopedic aftercare: Secondary | ICD-10-CM | POA: Diagnosis not present

## 2022-02-03 DIAGNOSIS — Z9181 History of falling: Secondary | ICD-10-CM | POA: Diagnosis not present

## 2022-02-03 DIAGNOSIS — M25521 Pain in right elbow: Secondary | ICD-10-CM | POA: Diagnosis not present

## 2022-02-03 DIAGNOSIS — R2689 Other abnormalities of gait and mobility: Secondary | ICD-10-CM | POA: Diagnosis not present

## 2022-02-03 DIAGNOSIS — M25821 Other specified joint disorders, right elbow: Secondary | ICD-10-CM | POA: Diagnosis not present

## 2022-02-03 DIAGNOSIS — M84331S Stress fracture, right ulna, sequela: Secondary | ICD-10-CM | POA: Diagnosis not present

## 2022-02-04 DIAGNOSIS — M25521 Pain in right elbow: Secondary | ICD-10-CM | POA: Diagnosis not present

## 2022-02-04 DIAGNOSIS — R2689 Other abnormalities of gait and mobility: Secondary | ICD-10-CM | POA: Diagnosis not present

## 2022-02-04 DIAGNOSIS — Z4789 Encounter for other orthopedic aftercare: Secondary | ICD-10-CM | POA: Diagnosis not present

## 2022-02-04 DIAGNOSIS — M84331S Stress fracture, right ulna, sequela: Secondary | ICD-10-CM | POA: Diagnosis not present

## 2022-02-04 DIAGNOSIS — Z9181 History of falling: Secondary | ICD-10-CM | POA: Diagnosis not present

## 2022-02-04 DIAGNOSIS — M25821 Other specified joint disorders, right elbow: Secondary | ICD-10-CM | POA: Diagnosis not present

## 2022-02-05 DIAGNOSIS — M25821 Other specified joint disorders, right elbow: Secondary | ICD-10-CM | POA: Diagnosis not present

## 2022-02-05 DIAGNOSIS — Z9181 History of falling: Secondary | ICD-10-CM | POA: Diagnosis not present

## 2022-02-05 DIAGNOSIS — Z4789 Encounter for other orthopedic aftercare: Secondary | ICD-10-CM | POA: Diagnosis not present

## 2022-02-05 DIAGNOSIS — M84331S Stress fracture, right ulna, sequela: Secondary | ICD-10-CM | POA: Diagnosis not present

## 2022-02-05 DIAGNOSIS — R2689 Other abnormalities of gait and mobility: Secondary | ICD-10-CM | POA: Diagnosis not present

## 2022-02-05 DIAGNOSIS — M25521 Pain in right elbow: Secondary | ICD-10-CM | POA: Diagnosis not present

## 2022-02-06 ENCOUNTER — Encounter: Payer: Self-pay | Admitting: Internal Medicine

## 2022-02-06 DIAGNOSIS — M25821 Other specified joint disorders, right elbow: Secondary | ICD-10-CM | POA: Diagnosis not present

## 2022-02-06 DIAGNOSIS — Z4789 Encounter for other orthopedic aftercare: Secondary | ICD-10-CM | POA: Diagnosis not present

## 2022-02-06 DIAGNOSIS — R2689 Other abnormalities of gait and mobility: Secondary | ICD-10-CM | POA: Diagnosis not present

## 2022-02-06 DIAGNOSIS — M84331S Stress fracture, right ulna, sequela: Secondary | ICD-10-CM | POA: Diagnosis not present

## 2022-02-06 DIAGNOSIS — Z9181 History of falling: Secondary | ICD-10-CM | POA: Diagnosis not present

## 2022-02-06 DIAGNOSIS — M25521 Pain in right elbow: Secondary | ICD-10-CM | POA: Diagnosis not present

## 2022-02-09 DIAGNOSIS — M79644 Pain in right finger(s): Secondary | ICD-10-CM | POA: Diagnosis not present

## 2022-02-09 DIAGNOSIS — M25521 Pain in right elbow: Secondary | ICD-10-CM | POA: Diagnosis not present

## 2022-02-09 DIAGNOSIS — Z4789 Encounter for other orthopedic aftercare: Secondary | ICD-10-CM | POA: Diagnosis not present

## 2022-02-09 DIAGNOSIS — M25821 Other specified joint disorders, right elbow: Secondary | ICD-10-CM | POA: Diagnosis not present

## 2022-02-09 DIAGNOSIS — R2689 Other abnormalities of gait and mobility: Secondary | ICD-10-CM | POA: Diagnosis not present

## 2022-02-09 DIAGNOSIS — M84331S Stress fracture, right ulna, sequela: Secondary | ICD-10-CM | POA: Diagnosis not present

## 2022-02-09 DIAGNOSIS — Z9181 History of falling: Secondary | ICD-10-CM | POA: Diagnosis not present

## 2022-02-10 DIAGNOSIS — M25821 Other specified joint disorders, right elbow: Secondary | ICD-10-CM | POA: Diagnosis not present

## 2022-02-10 DIAGNOSIS — Z4789 Encounter for other orthopedic aftercare: Secondary | ICD-10-CM | POA: Diagnosis not present

## 2022-02-10 DIAGNOSIS — Z9181 History of falling: Secondary | ICD-10-CM | POA: Diagnosis not present

## 2022-02-10 DIAGNOSIS — M84331S Stress fracture, right ulna, sequela: Secondary | ICD-10-CM | POA: Diagnosis not present

## 2022-02-10 DIAGNOSIS — R2689 Other abnormalities of gait and mobility: Secondary | ICD-10-CM | POA: Diagnosis not present

## 2022-02-10 DIAGNOSIS — M25521 Pain in right elbow: Secondary | ICD-10-CM | POA: Diagnosis not present

## 2022-02-11 DIAGNOSIS — R2689 Other abnormalities of gait and mobility: Secondary | ICD-10-CM | POA: Diagnosis not present

## 2022-02-11 DIAGNOSIS — M84331S Stress fracture, right ulna, sequela: Secondary | ICD-10-CM | POA: Diagnosis not present

## 2022-02-11 DIAGNOSIS — M25521 Pain in right elbow: Secondary | ICD-10-CM | POA: Diagnosis not present

## 2022-02-11 DIAGNOSIS — Z9181 History of falling: Secondary | ICD-10-CM | POA: Diagnosis not present

## 2022-02-11 DIAGNOSIS — M25821 Other specified joint disorders, right elbow: Secondary | ICD-10-CM | POA: Diagnosis not present

## 2022-02-11 DIAGNOSIS — Z4789 Encounter for other orthopedic aftercare: Secondary | ICD-10-CM | POA: Diagnosis not present

## 2022-02-12 DIAGNOSIS — M25521 Pain in right elbow: Secondary | ICD-10-CM | POA: Diagnosis not present

## 2022-02-12 DIAGNOSIS — M25821 Other specified joint disorders, right elbow: Secondary | ICD-10-CM | POA: Diagnosis not present

## 2022-02-12 DIAGNOSIS — R2689 Other abnormalities of gait and mobility: Secondary | ICD-10-CM | POA: Diagnosis not present

## 2022-02-12 DIAGNOSIS — M84331S Stress fracture, right ulna, sequela: Secondary | ICD-10-CM | POA: Diagnosis not present

## 2022-02-12 DIAGNOSIS — Z4789 Encounter for other orthopedic aftercare: Secondary | ICD-10-CM | POA: Diagnosis not present

## 2022-02-12 DIAGNOSIS — Z9181 History of falling: Secondary | ICD-10-CM | POA: Diagnosis not present

## 2022-02-13 DIAGNOSIS — Z9181 History of falling: Secondary | ICD-10-CM | POA: Diagnosis not present

## 2022-02-13 DIAGNOSIS — Z4789 Encounter for other orthopedic aftercare: Secondary | ICD-10-CM | POA: Diagnosis not present

## 2022-02-13 DIAGNOSIS — M25521 Pain in right elbow: Secondary | ICD-10-CM | POA: Diagnosis not present

## 2022-02-13 DIAGNOSIS — R2689 Other abnormalities of gait and mobility: Secondary | ICD-10-CM | POA: Diagnosis not present

## 2022-02-13 DIAGNOSIS — M25821 Other specified joint disorders, right elbow: Secondary | ICD-10-CM | POA: Diagnosis not present

## 2022-02-13 DIAGNOSIS — M84331S Stress fracture, right ulna, sequela: Secondary | ICD-10-CM | POA: Diagnosis not present

## 2022-02-16 DIAGNOSIS — Z4789 Encounter for other orthopedic aftercare: Secondary | ICD-10-CM | POA: Diagnosis not present

## 2022-02-16 DIAGNOSIS — M25821 Other specified joint disorders, right elbow: Secondary | ICD-10-CM | POA: Diagnosis not present

## 2022-02-16 DIAGNOSIS — Z9181 History of falling: Secondary | ICD-10-CM | POA: Diagnosis not present

## 2022-02-16 DIAGNOSIS — M25521 Pain in right elbow: Secondary | ICD-10-CM | POA: Diagnosis not present

## 2022-02-16 DIAGNOSIS — R2689 Other abnormalities of gait and mobility: Secondary | ICD-10-CM | POA: Diagnosis not present

## 2022-02-16 DIAGNOSIS — M84331S Stress fracture, right ulna, sequela: Secondary | ICD-10-CM | POA: Diagnosis not present

## 2022-02-17 ENCOUNTER — Non-Acute Institutional Stay (SKILLED_NURSING_FACILITY): Payer: Medicare Other | Admitting: Orthopedic Surgery

## 2022-02-17 ENCOUNTER — Encounter: Payer: Self-pay | Admitting: Orthopedic Surgery

## 2022-02-17 DIAGNOSIS — G609 Hereditary and idiopathic neuropathy, unspecified: Secondary | ICD-10-CM | POA: Diagnosis not present

## 2022-02-17 DIAGNOSIS — N3946 Mixed incontinence: Secondary | ICD-10-CM

## 2022-02-17 DIAGNOSIS — R600 Localized edema: Secondary | ICD-10-CM | POA: Diagnosis not present

## 2022-02-17 DIAGNOSIS — S42401S Unspecified fracture of lower end of right humerus, sequela: Secondary | ICD-10-CM

## 2022-02-17 DIAGNOSIS — E890 Postprocedural hypothyroidism: Secondary | ICD-10-CM | POA: Diagnosis not present

## 2022-02-17 DIAGNOSIS — M84331S Stress fracture, right ulna, sequela: Secondary | ICD-10-CM | POA: Diagnosis not present

## 2022-02-17 DIAGNOSIS — S42401A Unspecified fracture of lower end of right humerus, initial encounter for closed fracture: Secondary | ICD-10-CM | POA: Diagnosis not present

## 2022-02-17 DIAGNOSIS — Z9181 History of falling: Secondary | ICD-10-CM | POA: Diagnosis not present

## 2022-02-17 DIAGNOSIS — M25521 Pain in right elbow: Secondary | ICD-10-CM | POA: Diagnosis not present

## 2022-02-17 DIAGNOSIS — K5901 Slow transit constipation: Secondary | ICD-10-CM

## 2022-02-17 DIAGNOSIS — M25821 Other specified joint disorders, right elbow: Secondary | ICD-10-CM | POA: Diagnosis not present

## 2022-02-17 DIAGNOSIS — Z4789 Encounter for other orthopedic aftercare: Secondary | ICD-10-CM | POA: Diagnosis not present

## 2022-02-17 DIAGNOSIS — R2689 Other abnormalities of gait and mobility: Secondary | ICD-10-CM | POA: Diagnosis not present

## 2022-02-17 NOTE — Progress Notes (Signed)
Location:  [facility] Room Number: 151-A Place of Service:  SNF [phone]) Provider:  Dailyn Reith Kayleen Memos, MD  Patient Care Team: Shon Baton, MD as PCP - General (Internal Medicine) Minus Breeding, MD as PCP - Cardiology (Cardiology)  Extended Emergency Contact Information Primary Emergency Contact: [place],Ann Address: [address]          [place], [place] [address] [place] Phone: [phone] Relation: Relative Secondary Emergency Contact: [place] Phone: [phone] Relation: Daughter  Code Status:   Goals of care: Advanced Directive information    02/17/2022   10:37 AM  Advanced Directives  Does Patient Have a Medical Advance Directive? Yes  Type of Advance Directive Living will  Does patient want to make changes to medical advance directive? No - Patient declined     Chief Complaint  Patient presents with   Routine    HPI:  Pt is a 81 y.o. female seen today for medical management of chronic diseases.    She currently resides on the rehab unit at [facility] PMH: postural dizziness, hypothyroidism, peripheral neuropathy, arthritis, DDD, GERD, and skin cancer.   Right elbow fracture- 09/10 ORIF in [place] due to fall, revision 10/03 by Dr. Amedeo Plenty- f/u 11/02, off narcotic pain medication, pain stable with ibuprofen prn, reports some numbness to right fingers, 1+ assist with ADLs, goal to discharge to apartment with [facility] Localized edema- R>L, wearing compression stockings daily Hypothyroidism- TSH 3.20 10/2021, remains on levothyroxine Peripheral neuropathy- followed by Dr. Leta Baptist, recent lab work normal  Constipation- reports diarrhea x 2 days, reports using miralax prn in past, remains on senna and miralax QOD Urge/stress incontinence- followed by Dr. Matilde Sprang with [facility], unsuccessful trial of Myrbetriq, considering second opinion with [facility]   Diagnosed with  covid 12/2021 while in [place]. She did not take paxlovid. Lingering productive cough has resolved.   No recent falls or injuries. Ambulates with 4-point cane.   Recent blood pressures:  10/31- 135/66  10/30- 119/66, 124/68, 126/72  Recent weights:  10/31- 124.2 lbs  10/10- 127.6 lbs  09/14- 128 lbs (admission)      Past Medical History:  Diagnosis Date   Arthritis    Cancer ([place]) 1968   melanoma on right shin   Colon polyps    DDD (degenerative disc disease), cervical 03/29/2009   DG Cervical Spine Complete   Diverticulosis of sigmoid colon 01/2017   Edema    right lower leg/foot due to s/p melanoma excision   Gastritis    GERD (gastroesophageal reflux disease)    03-05-15 not an issue now   History of kidney stones    Hypothyroidism    IBS (irritable bowel syndrome)    Paresthesia    skin   Spondylosis 03/29/2009   DG Cervical Spine Complete   Thyroid disease    hypothyroid   Past Surgical History:  Procedure Laterality Date   ABDOMINAL HYSTERECTOMY  1979   CATARACT EXTRACTION, BILATERAL     8'15,. [place]  2007   posterior approach-no retained hardware   COLONOSCOPY     FOOT SURGERY Bilateral    hammertoe, neuromas, bunionectomy   MELANOMA EXCISION  1968   right shin/calf- calf measures 0.75 inches larger than left   THYROIDECTOMY, PARTIAL  2011   TONSILLECTOMY     TOTAL KNEE ARTHROPLASTY Left 03/19/2015   Procedure: LEFT TOTAL KNEE ARTHROPLASTY;  Surgeon: Paralee Cancel, MD;  Location: [facility];  Service: Orthopedics;  Laterality: Left;   TOTAL KNEE ARTHROPLASTY Right 04/27/2017   Procedure: RIGHT TOTAL KNEE ARTHROPLASTY;  Surgeon: Paralee Cancel, MD;  Location: [facility];  Service: Orthopedics;  Laterality: Right;  70 mins    Allergies  Allergen Reactions   Augmentin [Amoxicillin-Pot Clavulanate] Diarrhea and Nausea Only    Has patient had a PCN reaction causing immediate rash, facial/tongue/throat swelling, SOB or lightheadedness with  hypotension: No Has patient had a PCN reaction causing severe rash involving mucus membranes or skin necrosis: No Has patient had a PCN reaction that required hospitalization No Has patient had a PCN reaction occurring within the last 10 years: No If all of the above answers are "NO", then may proceed with Cephalosporin use.    Erythromycin Diarrhea and Nausea Only    Severe diarrhea    Outpatient Encounter Medications as of 02/17/2022  Medication Sig   acetaminophen (TYLENOL) 325 MG tablet Take 650 mg by mouth 3 (three) times daily as needed. every six hours PRN in addition to other pain medications   ibuprofen (ADVIL) 400 MG tablet Take 400 mg by mouth every 6 (six) hours as needed.   levothyroxine (SYNTHROID, LEVOTHROID) 50 MCG tablet Take 50 mcg by mouth daily.    melatonin 5 MG TABS Take 5 mg by mouth at bedtime.   Multiple Vitamins-Minerals (EMERGEN-C VITAMIN C PO) Take by mouth. 1,000 mg; amt: 1 packet; oral Once A Morning   polyethylene glycol (MIRALAX / GLYCOLAX) 17 g packet Take 17 g by mouth daily as needed.   senna-docusate (SENNA S) 8.6-50 MG tablet Take 1 tablet by mouth 2 (two) times daily as needed for mild constipation.   No facility-administered encounter medications on file as of 02/17/2022.    Review of Systems  Constitutional:  Negative for activity change, appetite change, fatigue and fever.  HENT:  Negative for congestion and trouble swallowing.   Eyes:  Negative for visual disturbance.  Respiratory:  Negative for cough, shortness of breath and wheezing.   Cardiovascular:  Positive for leg swelling. Negative for chest pain.  Gastrointestinal:  Positive for constipation and diarrhea. Negative for abdominal distention, abdominal pain, nausea and vomiting.  Genitourinary:  Positive for frequency and urgency. Negative for dysuria and hematuria.  Musculoskeletal:  Positive for arthralgias and gait problem.  Skin:  Positive for wound.  Neurological:  Positive for  weakness. Negative for dizziness and headaches.  Psychiatric/Behavioral:  Negative for confusion, dysphoric mood and sleep disturbance. The patient is not nervous/anxious.     Immunization History  Administered Date(s) Administered   Influenza, High Dose Seasonal PF 02/13/2019   Moderna Covid-19 Vaccine Bivalent Booster 31yr & up 02/04/2021   Moderna Sars-Covid-2 Vaccination 05/02/2019, 06/27/2019   Pneumococcal Conjugate-13 08/22/2013, 01/09/2014   Zoster Recombinat (Shingrix) 06/29/2017   Pertinent  Health Maintenance Due  Topic Date Due   INFLUENZA VACCINE  11/18/2021   DEXA SCAN  Completed       No data to display         Functional Status Survey:    Vitals:   02/17/22 1014  BP: 119/66  Pulse: 66  Resp: 14  Temp: 98 F (36.7 C)  SpO2: 95%  Weight: 125 lb (56.7 kg)  Height: '5\' 3"'$  (1.6 m)   Body mass index is 22.14 kg/m. Physical Exam Vitals reviewed.  Constitutional:      General: She is not in acute distress. HENT:     Head: Normocephalic.     Right Ear: There is no impacted cerumen.  Left Ear: There is no impacted cerumen.     Nose: Nose normal.     Mouth/Throat:     Mouth: Mucous membranes are moist.  Eyes:     General:        Right eye: No discharge.        Left eye: No discharge.  Cardiovascular:     Rate and Rhythm: Normal rate and regular rhythm.     Pulses: Normal pulses.     Heart sounds: Normal heart sounds.  Pulmonary:     Effort: Pulmonary effort is normal. No respiratory distress.     Breath sounds: Normal breath sounds. No wheezing.  Abdominal:     General: Bowel sounds are normal. There is no distension.     Palpations: Abdomen is soft.     Tenderness: There is no abdominal tenderness.  Musculoskeletal:     Cervical back: Neck supple.     Right lower leg: Edema present.     Left lower leg: Edema present.     Comments: R>L, non pitting, ted hose on, non pitting edema to right hand, right radial pulse 2+/ cap refill < 3 sec.    Skin:    General: Skin is warm and dry.     Capillary Refill: Capillary refill takes less than 2 seconds.     Comments: Right elbow surgical incision closed, CDI  Neurological:     General: No focal deficit present.     Mental Status: She is alert and oriented to person, place, and time.     Motor: Weakness present.     Gait: Gait abnormal.     Comments:  4 point cane  Psychiatric:        Mood and Affect: Mood normal.        Behavior: Behavior normal.     Labs reviewed: Recent Labs    01/06/22 0000  NA 136*  K 4.0  CL 101  CO2 24*  BUN 13  CREATININE 0.6  CALCIUM 8.3*   Recent Labs    11/17/21 0956 01/06/22 0000  AST  --  28  ALT  --  19  ALKPHOS  --  49  PROT 6.1  --   ALBUMIN  --  3.4*   Recent Labs    01/06/22 0000  WBC 3.0  HGB 12.9  HCT 37  PLT 291   Lab Results  Component Value Date   TSH 3.270 11/17/2021   Lab Results  Component Value Date   HGBA1C 5.3 11/17/2021   No results found for: "CHOL", "HDL", "LDLCALC", "LDLDIRECT", "TRIG", "CHOLHDL"  Significant Diagnostic Results in last 30 days:  No results found.  Assessment/Plan 1. Closed fracture of right elbow, sequela - 09/10 mechanical fall, ORIG in [place] - 10/03 revision by Dr. Amedeo Plenty (f/u 11/02) - surgical incision closed, CDI - non pitting edema to right hand - cont ibuprofen prn for pain - 1+ assist with ADLs - cont OT - goal to discharge home with [facility]  2. Localized edema - noted to BLE and right hand - cont ted hose  - cont right arm elevation  3. Postoperative hypothyroidism - TSH stable - cont levothyroxine  4. Idiopathic peripheral neuropathy - followed y Dr. Dr. Leta Baptist  5. Slow transit constipation - diarrhea x 2 days - will change Miralax to 2x/weekly  6. Mixed urge and stress incontinence - followed by [facility] - unsuccessful trial Myrbetriq  - discussed limiting caffeine and reducing fluid intake in evening -  considering second  opinion with [facility]    Family/ staff Communication: plan discussed with patient and nurse  Labs/tests ordered:  none

## 2022-02-18 DIAGNOSIS — M25521 Pain in right elbow: Secondary | ICD-10-CM | POA: Diagnosis not present

## 2022-02-18 DIAGNOSIS — Z4789 Encounter for other orthopedic aftercare: Secondary | ICD-10-CM | POA: Diagnosis not present

## 2022-02-18 DIAGNOSIS — M25821 Other specified joint disorders, right elbow: Secondary | ICD-10-CM | POA: Diagnosis not present

## 2022-02-18 DIAGNOSIS — M84331S Stress fracture, right ulna, sequela: Secondary | ICD-10-CM | POA: Diagnosis not present

## 2022-02-18 DIAGNOSIS — R2689 Other abnormalities of gait and mobility: Secondary | ICD-10-CM | POA: Diagnosis not present

## 2022-02-18 DIAGNOSIS — Z9181 History of falling: Secondary | ICD-10-CM | POA: Diagnosis not present

## 2022-02-19 DIAGNOSIS — Z4789 Encounter for other orthopedic aftercare: Secondary | ICD-10-CM | POA: Diagnosis not present

## 2022-02-19 DIAGNOSIS — M25821 Other specified joint disorders, right elbow: Secondary | ICD-10-CM | POA: Diagnosis not present

## 2022-02-19 DIAGNOSIS — M25521 Pain in right elbow: Secondary | ICD-10-CM | POA: Diagnosis not present

## 2022-02-19 DIAGNOSIS — Z9181 History of falling: Secondary | ICD-10-CM | POA: Diagnosis not present

## 2022-02-20 DIAGNOSIS — R2689 Other abnormalities of gait and mobility: Secondary | ICD-10-CM | POA: Diagnosis not present

## 2022-02-20 DIAGNOSIS — Z9181 History of falling: Secondary | ICD-10-CM | POA: Diagnosis not present

## 2022-02-20 DIAGNOSIS — M25521 Pain in right elbow: Secondary | ICD-10-CM | POA: Diagnosis not present

## 2022-02-20 DIAGNOSIS — M84331S Stress fracture, right ulna, sequela: Secondary | ICD-10-CM | POA: Diagnosis not present

## 2022-02-20 DIAGNOSIS — M25821 Other specified joint disorders, right elbow: Secondary | ICD-10-CM | POA: Diagnosis not present

## 2022-02-20 DIAGNOSIS — Z4789 Encounter for other orthopedic aftercare: Secondary | ICD-10-CM | POA: Diagnosis not present

## 2022-02-23 DIAGNOSIS — N390 Urinary tract infection, site not specified: Secondary | ICD-10-CM | POA: Insufficient documentation

## 2022-02-23 DIAGNOSIS — N3941 Urge incontinence: Secondary | ICD-10-CM | POA: Diagnosis not present

## 2022-02-24 ENCOUNTER — Encounter: Payer: Self-pay | Admitting: Orthopedic Surgery

## 2022-02-24 ENCOUNTER — Non-Acute Institutional Stay (SKILLED_NURSING_FACILITY): Payer: Medicare Other | Admitting: Orthopedic Surgery

## 2022-02-24 DIAGNOSIS — S42401A Unspecified fracture of lower end of right humerus, initial encounter for closed fracture: Secondary | ICD-10-CM

## 2022-02-24 DIAGNOSIS — S42401S Unspecified fracture of lower end of right humerus, sequela: Secondary | ICD-10-CM

## 2022-02-24 DIAGNOSIS — Z9181 History of falling: Secondary | ICD-10-CM | POA: Diagnosis not present

## 2022-02-24 DIAGNOSIS — R600 Localized edema: Secondary | ICD-10-CM

## 2022-02-24 DIAGNOSIS — E890 Postprocedural hypothyroidism: Secondary | ICD-10-CM

## 2022-02-24 DIAGNOSIS — N3000 Acute cystitis without hematuria: Secondary | ICD-10-CM

## 2022-02-24 DIAGNOSIS — R2689 Other abnormalities of gait and mobility: Secondary | ICD-10-CM | POA: Diagnosis not present

## 2022-02-24 DIAGNOSIS — G609 Hereditary and idiopathic neuropathy, unspecified: Secondary | ICD-10-CM

## 2022-02-24 DIAGNOSIS — K5901 Slow transit constipation: Secondary | ICD-10-CM

## 2022-02-24 DIAGNOSIS — M84331S Stress fracture, right ulna, sequela: Secondary | ICD-10-CM | POA: Diagnosis not present

## 2022-02-24 DIAGNOSIS — N3946 Mixed incontinence: Secondary | ICD-10-CM

## 2022-02-24 DIAGNOSIS — U071 COVID-19: Secondary | ICD-10-CM

## 2022-02-24 DIAGNOSIS — Z4789 Encounter for other orthopedic aftercare: Secondary | ICD-10-CM | POA: Diagnosis not present

## 2022-02-24 NOTE — Progress Notes (Signed)
Location:  [facility] Room Number: 151 Place of Service:  SNF [phone]) Provider:  Jehan Ranganathan Kayleen Memos, MD  Patient Care Team: Shon Baton, MD as PCP - General (Internal Medicine) Minus Breeding, MD as PCP - Cardiology (Cardiology)  Extended Emergency Contact Information Primary Emergency Contact: [place],Ann Address: [address]          [place], [place] [address] [place] Phone: [phone] Relation: Relative Secondary Emergency Contact: [place] Phone: [phone] Relation: Daughter  Code Status:  Full Goals of care: Advanced Directive information    02/17/2022   10:37 AM  Advanced Directives  Does Patient Have a Medical Advance Directive? Yes  Type of Advance Directive Living will  Does patient want to make changes to medical advance directive? No - Patient declined     Chief Complaint  Patient presents with   Discharge Note    Discharge    HPI:  Pt is a 81 y.o. female seen today for discharge evaluation.      Expand All Collapse All  Location:  [facility] Room Number: 151-A Place of Service:  SNF [phone]) Provider:  Tatayana Beshears Kayleen Memos, MD   Patient Care Team: Shon Baton, MD as PCP - General (Internal Medicine) Minus Breeding, MD as PCP - Cardiology (Cardiology)   Extended Emergency Contact Information Primary Emergency Contact: Chas[place],Ann Address: [address]          [place], [place] [address] [place] Phone: [phone] Relation: Relative Secondary Emergency Contact: [place] Phone: [phone] Relation: Daughter   Code Status:   Goals of care: Advanced Directive information     02/17/2022   10:37 AM  Advanced Directives  Does Patient Have a Medical Advance Directive? Yes  Type of Advance Directive Living will  Does patient want to make changes to medical advance directive? No -  Patient declined           Chief Complaint  Patient presents with   Routine      HPI:  Pt is a 81 y.o. female seen today for medical management of chronic diseases.     She currently resides on the rehab unit at [facility] PMH: postural dizziness, hypothyroidism, peripheral neuropathy, arthritis, DDD, GERD, and skin cancer.    Right elbow fracture- 09/10 ORIF in [place] due to fall, revision 10/03 by Dr. Amedeo Plenty- f/u 11/02, off narcotic pain medication, pain stable with ibuprofen prn, right arm remains wrapped in brace, reports some numbness to right fingers and swelling to right hand Localized edema- R>L, wearing compression stockings daily Hypothyroidism- TSH 3.20 10/2021, remains on levothyroxine Peripheral neuropathy- followed by Dr. Leta Baptist, recent lab work normal  Constipation- remains on senna prn and miralax 2x/week Urge/stress incontinence- followed by Dr. Orinda Kenner- [facility]- recently evaluated- prescribed estradiol, unsuccessful trial of Myrbetriq, recent UA revealed 1+ leukocytes, 11/05 started on Cipro 500 mg BID x 7 days Recent covid- diagnosed in [place] 09/10, did not take paxlovid, off isolation precautions at [facility] 09/14, she did have a non productive cough after recovery- CXR unremarkable  Today, she denies severe pain to right arm. Appetite fair. Ambulating without device well, supervision only. No recent falls or injures. Plans to discharge back home 11/08. Sister and [facility] to help at home.      Past Surgical History:  Procedure Laterality Date   ABDOMINAL HYSTERECTOMY  1979   CATARACT EXTRACTION, BILATERAL     8'15,. 9'15  CERVICAL SPINE SURGERY  2007   posterior approach-no retained hardware   COLONOSCOPY     FOOT SURGERY Bilateral    hammertoe, neuromas, bunionectomy   MELANOMA EXCISION  1968   right shin/calf- calf measures 0.75 inches larger than left   THYROIDECTOMY, PARTIAL  2011   TONSILLECTOMY     TOTAL KNEE ARTHROPLASTY Left  03/19/2015   Procedure: LEFT TOTAL KNEE ARTHROPLASTY;  Surgeon: Paralee Cancel, MD;  Location: [facility];  Service: Orthopedics;  Laterality: Left;   TOTAL KNEE ARTHROPLASTY Right 04/27/2017   Procedure: RIGHT TOTAL KNEE ARTHROPLASTY;  Surgeon: Paralee Cancel, MD;  Location: [facility];  Service: Orthopedics;  Laterality: Right;  70 mins    Allergies  Allergen Reactions   Augmentin [Amoxicillin-Pot Clavulanate] Diarrhea and Nausea Only    Has patient had a PCN reaction causing immediate rash, facial/tongue/throat swelling, SOB or lightheadedness with hypotension: No Has patient had a PCN reaction causing severe rash involving mucus membranes or skin necrosis: No Has patient had a PCN reaction that required hospitalization No Has patient had a PCN reaction occurring within the last 10 years: No If all of the above answers are "NO", then may proceed with Cephalosporin use.    Erythromycin Diarrhea and Nausea Only    Severe diarrhea    Outpatient Encounter Medications as of 02/24/2022  Medication Sig   acetaminophen (TYLENOL) 325 MG tablet Take 650 mg by mouth 3 (three) times daily as needed. every six hours PRN in addition to other pain medications   ciprofloxacin (CIPRO) 500 MG tablet Take 500 mg by mouth 2 (two) times daily.   ibuprofen (ADVIL) 400 MG tablet Take 400 mg by mouth every 6 (six) hours as needed.   levothyroxine (SYNTHROID, LEVOTHROID) 50 MCG tablet Take 50 mcg by mouth daily.    melatonin 5 MG TABS Take 5 mg by mouth at bedtime.   Multiple Vitamins-Minerals (EMERGEN-C VITAMIN C PO) Take by mouth. 1,000 mg; amt: 1 packet; oral Once A Morning   polyethylene glycol (MIRALAX / GLYCOLAX) 17 g packet Take 17 g by mouth 2 (two) times a week. Administer on Monday and Thursdays   senna-docusate (SENNA S) 8.6-50 MG tablet Take 1 tablet by mouth 2 (two) times daily as needed for mild constipation.   No facility-administered encounter medications on file as of 02/24/2022.    Review of Systems   Constitutional:  Negative for activity change, appetite change, chills, fatigue and fever.  HENT:  Negative for congestion and trouble swallowing.   Eyes:  Negative for visual disturbance.  Respiratory:  Negative for cough, shortness of breath and wheezing.   Cardiovascular:  Positive for leg swelling. Negative for chest pain.  Gastrointestinal:  Positive for constipation. Negative for abdominal distention, abdominal pain, diarrhea, nausea and vomiting.  Genitourinary:  Positive for frequency. Negative for dysuria, hematuria and vaginal discharge.  Musculoskeletal:  Positive for arthralgias and gait problem.  Skin:  Negative for wound.  Neurological:  Negative for dizziness, weakness and headaches.  Psychiatric/Behavioral:  Negative for confusion, dysphoric mood and sleep disturbance. The patient is not nervous/anxious.     Immunization History  Administered Date(s) Administered   Influenza, High Dose Seasonal PF 02/13/2019   Moderna Covid-19 Vaccine Bivalent Booster 29yr & up 02/04/2021   Moderna Sars-Covid-2 Vaccination 05/02/2019, 06/27/2019   Pneumococcal Conjugate-13 08/22/2013, 01/09/2014   Zoster Recombinat (Shingrix) 06/29/2017   Pertinent  Health Maintenance Due  Topic Date Due   INFLUENZA VACCINE  11/18/2021   DEXA SCAN  Completed  No data to display         Functional Status Survey:    Vitals:   02/24/22 1045  BP: (!) 150/83  Pulse: (!) 59  Resp: 18  Temp: 97.8 F (36.6 C)  SpO2: 96%  Weight: 124 lb 6.4 oz (56.4 kg)  Height: '5\' 3"'$  (1.6 m)   Body mass index is 22.04 kg/m. Physical Exam Vitals reviewed.  Constitutional:      General: She is not in acute distress. HENT:     Head: Normocephalic.  Eyes:     General:        Right eye: No discharge.        Left eye: No discharge.  Cardiovascular:     Rate and Rhythm: Normal rate and regular rhythm.     Pulses: Normal pulses.     Heart sounds: Normal heart sounds.  Pulmonary:     Effort:  Pulmonary effort is normal. No respiratory distress.     Breath sounds: Normal breath sounds. No wheezing.  Abdominal:     General: Bowel sounds are normal. There is no distension.     Palpations: Abdomen is soft.     Tenderness: There is no abdominal tenderness.  Musculoskeletal:     Cervical back: Neck supple.     Right lower leg: Edema present.     Left lower leg: Edema present.     Comments: Non pitting, RUE with increased swelling to hand/fingers, tenderness with movement, radial pulse 2+, cap refill < 3 sec  Skin:    General: Skin is warm and dry.     Capillary Refill: Capillary refill takes less than 2 seconds.  Neurological:     General: No focal deficit present.     Mental Status: She is alert and oriented to person, place, and time.  Psychiatric:        Mood and Affect: Mood normal.        Behavior: Behavior normal.     Labs reviewed: Recent Labs    01/06/22 0000  NA 136*  K 4.0  CL 101  CO2 24*  BUN 13  CREATININE 0.6  CALCIUM 8.3*   Recent Labs    11/17/21 0956 01/06/22 0000  AST  --  28  ALT  --  19  ALKPHOS  --  49  PROT 6.1  --   ALBUMIN  --  3.4*   Recent Labs    01/06/22 0000  WBC 3.0  HGB 12.9  HCT 37  PLT 291   Lab Results  Component Value Date   TSH 3.270 11/17/2021   Lab Results  Component Value Date   HGBA1C 5.3 11/17/2021   No results found for: "CHOL", "HDL", "LDLCALC", "LDLDIRECT", "TRIG", "CHOLHDL"  Significant Diagnostic Results in last 30 days:  No results found.  Assessment/Plan 1. Closed fracture of right elbow, sequela - 09/10 mechanical fall, ORIG in [place] - 10/03 revision by Dr. Amedeo Plenty (f/u 11/02) - non pitting edema to right hand - cont arm elevation - cont ibuprofen prn for pain - cont PT/OT at discharge- [facility] ordered  2. Localized edema - non pitting - cont ted hose  3. Postoperative hypothyroidism - TSH stable - cont levothyroxine  4. Idiopathic peripheral neuropathy - followed by  Dr. Leta Baptist  5. Slow transit constipation - abdomen soft - cont senna prn and miralax 2x/weekly  6. Mixed urge and stress incontinence - evaluated by Dr. Orinda Kenner - cont estradiol cream - unsuccessful trial Myrbetriq in past  7. Acute cystitis without hematuria - UA 1+ leukocytes - 11/05 Cipro 500 mg po BID x 7 days started  8. COVID - diagnosed 09/10 - did not take paxlovid - had non productive cough after  - CXR- unremarkable - not symptoms at this time  Advised to follow up with Dr. Virgina Jock within 2 weeks.    Family/ staff Communication: plan discussed with patient and nurse  Labs/tests ordered:  none

## 2022-02-25 DIAGNOSIS — M25521 Pain in right elbow: Secondary | ICD-10-CM | POA: Diagnosis not present

## 2022-02-25 DIAGNOSIS — Z4789 Encounter for other orthopedic aftercare: Secondary | ICD-10-CM | POA: Diagnosis not present

## 2022-02-25 DIAGNOSIS — M25821 Other specified joint disorders, right elbow: Secondary | ICD-10-CM | POA: Diagnosis not present

## 2022-02-25 DIAGNOSIS — Z9181 History of falling: Secondary | ICD-10-CM | POA: Diagnosis not present

## 2022-02-26 DIAGNOSIS — M25621 Stiffness of right elbow, not elsewhere classified: Secondary | ICD-10-CM | POA: Diagnosis not present

## 2022-03-02 DIAGNOSIS — R2689 Other abnormalities of gait and mobility: Secondary | ICD-10-CM | POA: Diagnosis not present

## 2022-03-02 DIAGNOSIS — Z9181 History of falling: Secondary | ICD-10-CM | POA: Diagnosis not present

## 2022-03-02 DIAGNOSIS — M84331S Stress fracture, right ulna, sequela: Secondary | ICD-10-CM | POA: Diagnosis not present

## 2022-03-02 DIAGNOSIS — Z4789 Encounter for other orthopedic aftercare: Secondary | ICD-10-CM | POA: Diagnosis not present

## 2022-03-03 DIAGNOSIS — M25621 Stiffness of right elbow, not elsewhere classified: Secondary | ICD-10-CM | POA: Diagnosis not present

## 2022-03-05 DIAGNOSIS — M25621 Stiffness of right elbow, not elsewhere classified: Secondary | ICD-10-CM | POA: Diagnosis not present

## 2022-03-05 DIAGNOSIS — Z4789 Encounter for other orthopedic aftercare: Secondary | ICD-10-CM | POA: Diagnosis not present

## 2022-03-06 DIAGNOSIS — M25621 Stiffness of right elbow, not elsewhere classified: Secondary | ICD-10-CM | POA: Diagnosis not present

## 2022-03-09 DIAGNOSIS — M25621 Stiffness of right elbow, not elsewhere classified: Secondary | ICD-10-CM | POA: Diagnosis not present

## 2022-03-10 DIAGNOSIS — R32 Unspecified urinary incontinence: Secondary | ICD-10-CM | POA: Diagnosis not present

## 2022-03-10 DIAGNOSIS — E039 Hypothyroidism, unspecified: Secondary | ICD-10-CM | POA: Diagnosis not present

## 2022-03-10 DIAGNOSIS — S42261A Displaced fracture of lesser tuberosity of right humerus, initial encounter for closed fracture: Secondary | ICD-10-CM | POA: Diagnosis not present

## 2022-03-10 DIAGNOSIS — K581 Irritable bowel syndrome with constipation: Secondary | ICD-10-CM | POA: Diagnosis not present

## 2022-03-11 DIAGNOSIS — M25621 Stiffness of right elbow, not elsewhere classified: Secondary | ICD-10-CM | POA: Diagnosis not present

## 2022-03-16 DIAGNOSIS — M84331S Stress fracture, right ulna, sequela: Secondary | ICD-10-CM | POA: Diagnosis not present

## 2022-03-16 DIAGNOSIS — Z4789 Encounter for other orthopedic aftercare: Secondary | ICD-10-CM | POA: Diagnosis not present

## 2022-03-16 DIAGNOSIS — Z9181 History of falling: Secondary | ICD-10-CM | POA: Diagnosis not present

## 2022-03-16 DIAGNOSIS — R2689 Other abnormalities of gait and mobility: Secondary | ICD-10-CM | POA: Diagnosis not present

## 2022-03-18 DIAGNOSIS — Z85820 Personal history of malignant melanoma of skin: Secondary | ICD-10-CM | POA: Diagnosis not present

## 2022-03-18 DIAGNOSIS — L821 Other seborrheic keratosis: Secondary | ICD-10-CM | POA: Diagnosis not present

## 2022-03-18 DIAGNOSIS — L218 Other seborrheic dermatitis: Secondary | ICD-10-CM | POA: Diagnosis not present

## 2022-03-18 DIAGNOSIS — D2271 Melanocytic nevi of right lower limb, including hip: Secondary | ICD-10-CM | POA: Diagnosis not present

## 2022-03-19 DIAGNOSIS — M25621 Stiffness of right elbow, not elsewhere classified: Secondary | ICD-10-CM | POA: Diagnosis not present

## 2022-03-20 DIAGNOSIS — M25621 Stiffness of right elbow, not elsewhere classified: Secondary | ICD-10-CM | POA: Diagnosis not present

## 2022-03-23 DIAGNOSIS — M25621 Stiffness of right elbow, not elsewhere classified: Secondary | ICD-10-CM | POA: Diagnosis not present

## 2022-03-25 DIAGNOSIS — M25641 Stiffness of right hand, not elsewhere classified: Secondary | ICD-10-CM | POA: Diagnosis not present

## 2022-03-25 DIAGNOSIS — M79641 Pain in right hand: Secondary | ICD-10-CM | POA: Diagnosis not present

## 2022-03-25 DIAGNOSIS — S52021A Displaced fracture of olecranon process without intraarticular extension of right ulna, initial encounter for closed fracture: Secondary | ICD-10-CM | POA: Diagnosis not present

## 2022-03-25 DIAGNOSIS — Z4789 Encounter for other orthopedic aftercare: Secondary | ICD-10-CM | POA: Diagnosis not present

## 2022-03-26 DIAGNOSIS — M25621 Stiffness of right elbow, not elsewhere classified: Secondary | ICD-10-CM | POA: Diagnosis not present

## 2022-03-27 DIAGNOSIS — M25621 Stiffness of right elbow, not elsewhere classified: Secondary | ICD-10-CM | POA: Diagnosis not present

## 2022-03-30 DIAGNOSIS — M25621 Stiffness of right elbow, not elsewhere classified: Secondary | ICD-10-CM | POA: Diagnosis not present

## 2022-04-02 DIAGNOSIS — M25621 Stiffness of right elbow, not elsewhere classified: Secondary | ICD-10-CM | POA: Diagnosis not present

## 2022-04-03 DIAGNOSIS — M25621 Stiffness of right elbow, not elsewhere classified: Secondary | ICD-10-CM | POA: Diagnosis not present

## 2022-04-07 DIAGNOSIS — M25621 Stiffness of right elbow, not elsewhere classified: Secondary | ICD-10-CM | POA: Diagnosis not present

## 2022-04-09 DIAGNOSIS — M25621 Stiffness of right elbow, not elsewhere classified: Secondary | ICD-10-CM | POA: Diagnosis not present

## 2022-04-10 DIAGNOSIS — M25621 Stiffness of right elbow, not elsewhere classified: Secondary | ICD-10-CM | POA: Diagnosis not present

## 2022-04-14 DIAGNOSIS — M25521 Pain in right elbow: Secondary | ICD-10-CM | POA: Diagnosis not present

## 2022-04-16 DIAGNOSIS — M25641 Stiffness of right hand, not elsewhere classified: Secondary | ICD-10-CM | POA: Diagnosis not present

## 2022-04-16 DIAGNOSIS — S52021A Displaced fracture of olecranon process without intraarticular extension of right ulna, initial encounter for closed fracture: Secondary | ICD-10-CM | POA: Diagnosis not present

## 2022-04-16 DIAGNOSIS — Z4789 Encounter for other orthopedic aftercare: Secondary | ICD-10-CM | POA: Diagnosis not present

## 2022-04-16 DIAGNOSIS — M79641 Pain in right hand: Secondary | ICD-10-CM | POA: Diagnosis not present

## 2022-04-17 DIAGNOSIS — M25521 Pain in right elbow: Secondary | ICD-10-CM | POA: Diagnosis not present

## 2022-04-21 DIAGNOSIS — M25621 Stiffness of right elbow, not elsewhere classified: Secondary | ICD-10-CM | POA: Diagnosis not present

## 2022-04-21 DIAGNOSIS — M25521 Pain in right elbow: Secondary | ICD-10-CM | POA: Diagnosis not present

## 2022-04-24 DIAGNOSIS — M25521 Pain in right elbow: Secondary | ICD-10-CM | POA: Diagnosis not present

## 2022-04-24 DIAGNOSIS — M25621 Stiffness of right elbow, not elsewhere classified: Secondary | ICD-10-CM | POA: Diagnosis not present

## 2022-05-01 DIAGNOSIS — M25621 Stiffness of right elbow, not elsewhere classified: Secondary | ICD-10-CM | POA: Diagnosis not present

## 2022-05-01 DIAGNOSIS — M25521 Pain in right elbow: Secondary | ICD-10-CM | POA: Diagnosis not present

## 2022-05-05 DIAGNOSIS — M25521 Pain in right elbow: Secondary | ICD-10-CM | POA: Diagnosis not present

## 2022-05-05 DIAGNOSIS — M25621 Stiffness of right elbow, not elsewhere classified: Secondary | ICD-10-CM | POA: Diagnosis not present

## 2022-05-08 DIAGNOSIS — M25521 Pain in right elbow: Secondary | ICD-10-CM | POA: Diagnosis not present

## 2022-05-08 DIAGNOSIS — M25621 Stiffness of right elbow, not elsewhere classified: Secondary | ICD-10-CM | POA: Diagnosis not present

## 2022-05-11 ENCOUNTER — Ambulatory Visit: Payer: Medicare Other

## 2022-05-12 DIAGNOSIS — M25521 Pain in right elbow: Secondary | ICD-10-CM | POA: Diagnosis not present

## 2022-05-12 DIAGNOSIS — M25621 Stiffness of right elbow, not elsewhere classified: Secondary | ICD-10-CM | POA: Diagnosis not present

## 2022-05-14 ENCOUNTER — Encounter: Payer: Self-pay | Admitting: Internal Medicine

## 2022-05-15 DIAGNOSIS — M25621 Stiffness of right elbow, not elsewhere classified: Secondary | ICD-10-CM | POA: Diagnosis not present

## 2022-05-15 DIAGNOSIS — M25521 Pain in right elbow: Secondary | ICD-10-CM | POA: Diagnosis not present

## 2022-05-19 DIAGNOSIS — M25621 Stiffness of right elbow, not elsewhere classified: Secondary | ICD-10-CM | POA: Diagnosis not present

## 2022-05-19 DIAGNOSIS — M25521 Pain in right elbow: Secondary | ICD-10-CM | POA: Diagnosis not present

## 2022-05-25 DIAGNOSIS — N3281 Overactive bladder: Secondary | ICD-10-CM | POA: Diagnosis not present

## 2022-05-26 DIAGNOSIS — M25621 Stiffness of right elbow, not elsewhere classified: Secondary | ICD-10-CM | POA: Diagnosis not present

## 2022-05-27 DIAGNOSIS — E039 Hypothyroidism, unspecified: Secondary | ICD-10-CM | POA: Diagnosis not present

## 2022-05-27 DIAGNOSIS — M810 Age-related osteoporosis without current pathological fracture: Secondary | ICD-10-CM | POA: Diagnosis not present

## 2022-05-28 ENCOUNTER — Ambulatory Visit: Payer: Medicare Other

## 2022-05-28 DIAGNOSIS — S52021A Displaced fracture of olecranon process without intraarticular extension of right ulna, initial encounter for closed fracture: Secondary | ICD-10-CM | POA: Diagnosis not present

## 2022-05-28 DIAGNOSIS — Z4789 Encounter for other orthopedic aftercare: Secondary | ICD-10-CM | POA: Diagnosis not present

## 2022-05-28 DIAGNOSIS — M25641 Stiffness of right hand, not elsewhere classified: Secondary | ICD-10-CM | POA: Diagnosis not present

## 2022-06-02 DIAGNOSIS — M25621 Stiffness of right elbow, not elsewhere classified: Secondary | ICD-10-CM | POA: Diagnosis not present

## 2022-06-04 DIAGNOSIS — M419 Scoliosis, unspecified: Secondary | ICD-10-CM | POA: Diagnosis not present

## 2022-06-04 DIAGNOSIS — Z0000 Encounter for general adult medical examination without abnormal findings: Secondary | ICD-10-CM | POA: Diagnosis not present

## 2022-06-04 DIAGNOSIS — R32 Unspecified urinary incontinence: Secondary | ICD-10-CM | POA: Diagnosis not present

## 2022-06-04 DIAGNOSIS — C439 Malignant melanoma of skin, unspecified: Secondary | ICD-10-CM | POA: Diagnosis not present

## 2022-06-04 DIAGNOSIS — E039 Hypothyroidism, unspecified: Secondary | ICD-10-CM | POA: Diagnosis not present

## 2022-06-09 ENCOUNTER — Other Ambulatory Visit: Payer: Self-pay | Admitting: Internal Medicine

## 2022-06-09 DIAGNOSIS — M25621 Stiffness of right elbow, not elsewhere classified: Secondary | ICD-10-CM | POA: Diagnosis not present

## 2022-06-09 DIAGNOSIS — E890 Postprocedural hypothyroidism: Secondary | ICD-10-CM

## 2022-06-09 DIAGNOSIS — E041 Nontoxic single thyroid nodule: Secondary | ICD-10-CM

## 2022-06-16 DIAGNOSIS — K080 Exfoliation of teeth due to systemic causes: Secondary | ICD-10-CM | POA: Diagnosis not present

## 2022-06-16 DIAGNOSIS — M25621 Stiffness of right elbow, not elsewhere classified: Secondary | ICD-10-CM | POA: Diagnosis not present

## 2022-06-18 ENCOUNTER — Ambulatory Visit: Payer: Medicare Other | Admitting: Internal Medicine

## 2022-06-18 ENCOUNTER — Encounter: Payer: Self-pay | Admitting: Internal Medicine

## 2022-06-18 VITALS — BP 118/68 | HR 68 | Ht 63.0 in | Wt 129.0 lb

## 2022-06-18 DIAGNOSIS — Z86010 Personal history of colonic polyps: Secondary | ICD-10-CM

## 2022-06-18 DIAGNOSIS — K5900 Constipation, unspecified: Secondary | ICD-10-CM | POA: Diagnosis not present

## 2022-06-18 DIAGNOSIS — R109 Unspecified abdominal pain: Secondary | ICD-10-CM | POA: Diagnosis not present

## 2022-06-18 NOTE — Patient Instructions (Signed)
_______________________________________________________  If your blood pressure at your visit was 140/90 or greater, please contact your primary care physician to follow up on this.  _______________________________________________________  If you are age 84 or older, your body mass index should be between 23-30. Your Body mass index is 22.85 kg/m. If this is out of the aforementioned range listed, please consider follow up with your Primary Care Provider.  If you are age 13 or younger, your body mass index should be between 19-25. Your Body mass index is 22.85 kg/m. If this is out of the aformentioned range listed, please consider follow up with your Primary Care Provider.   ________________________________________________________  The [facility] providers would like to encourage you to use [facility] to communicate with providers for non-urgent requests or questions.  Due to long hold times on the telephone, sending your provider a message by [facility] may be a faster and more efficient way to get a response.  Please allow 48 business hours for a response.  Please remember that this is for non-urgent requests.  _______________________________________________________  Dr Henrene Pastor recommends that you complete a bowel purge (to clean out your bowels). Please do the following: Purchase a bottle of Miralax over the counter as well as a box of 5 mg dulcolax tablets. Take 4 dulcolax tablets. Wait 1 hour. You will then drink 6-8 capfuls of Miralax mixed in an adequate amount of water/juice/gatorade (you may choose which of these liquids to drink) over the next 2-3 hours. You should expect results within 1 to 6 hours after completing the bowel purge.  After you have completed this - take one dose of MIralax daily

## 2022-06-18 NOTE — Progress Notes (Signed)
HISTORY OF PRESENT ILLNESS:  Morgan Gregory is a 82 y.o. female with a past medical history as listed below.  She has been followed in this office for a family history of colon cancer and personal history of adenomatous colon polyps.  She has undergone multiple colonoscopies over the past 30 years.  Most recently 2018.  That examination revealed sigmoid diverticulosis.  Otherwise normal.  No further follow-up recommended due to age.  Patient presents today with a chief complaint of severe constipation associated with some abdominal cramping.  She tells me that she was in her usual state of health until September 2023, when while visiting [place], she sustained a fall and severely injured her right shoulder and right arm.  She underwent surgery in [place].  Thereafter has been in rehab.  Since that time she has had issues with constipation.  She tells me that she was taking MiraLAX twice weekly and Senokot twice weekly without much results.  She strains to move her bowels.  Small ball-like bowel movement this morning.  No bleeding.  Uncomfortable when she tries to defecate.  No nausea or vomiting.  Eating without difficulty.  No bleeding.  Currently not taking any agents for constipation.  She is distraught.  She has had some weight loss since her accident.  Currently living at [facility].  Blood work from September 2023 shows a hemoglobin of 12.9.  REVIEW OF SYSTEMS:  All non-GI ROS negative as otherwise stated in HPI except for excessive urination, urinary frequency, urinary leakage, ankle edema, muscle cramps, psoriasis, fatigue, arthritis  Past Medical History:  Diagnosis Date   Arthritis    Cancer ([place]) 1968   melanoma on right shin   Colon polyps    DDD (degenerative disc disease), cervical 03/29/2009   DG Cervical Spine Complete   Diverticulosis of sigmoid colon 01/2017   Edema    right lower leg/foot due to s/p melanoma excision   Gastritis    GERD (gastroesophageal reflux disease)     03-05-15 not an issue now   History of kidney stones    Hypothyroidism    IBS (irritable bowel syndrome)    Paresthesia    skin   Spondylosis 03/29/2009   DG Cervical Spine Complete   Thyroid disease    hypothyroid    Past Surgical History:  Procedure Laterality Date   ABDOMINAL HYSTERECTOMY  1979   CATARACT EXTRACTION, BILATERAL     8'15,. 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 Her smoking use included cigarettes. She started smoking about 64 years ago. She has a 4.50 pack-year smoking history. She has never used smokeless tobacco. She reports current alcohol use of about 10.0 standard drinks of alcohol per week. She reports that she does not use  drugs.  family history includes Colon cancer (age of onset: 53) in her mother; Emphysema in her father; Heart attack (age of onset: 59) in her brother.  Allergies  Allergen Reactions   Augmentin [Amoxicillin-Pot Clavulanate] Diarrhea and Nausea Only    Has patient had a PCN reaction causing immediate rash, facial/tongue/throat swelling, SOB or lightheadedness with hypotension: No Has patient had a PCN reaction causing severe rash involving mucus membranes or skin necrosis: No Has patient had a PCN reaction that required hospitalization No Has  patient had a PCN reaction occurring within the last 10 years: No If all of the above answers are "NO", then may proceed with Cephalosporin use.    Erythromycin Diarrhea and Nausea Only    Severe diarrhea       PHYSICAL EXAMINATION: Vital signs: BP 118/68   Pulse 68   Ht '5\' 3"'$  (1.6 m)   Wt 129 lb (58.5 kg)   BMI 22.85 kg/m   Constitutional: Thin frail elderly female, no acute distress Psychiatric: alert and oriented x3, cooperative.  Pleasant Eyes: extraocular movements intact, anicteric, conjunctiva pink Mouth: oral pharynx moist, no lesions Neck: supple no lymphadenopathy Cardiovascular: heart regular rate and rhythm, no murmur Lungs: clear to auscultation bilaterally Abdomen: soft, nontender, nondistended, no obvious ascites, no peritoneal signs, normal bowel sounds, no organomegaly Rectal: Omitted Extremities: no clubbing or cyanosis.  2+ lower extremity edema bilaterally Skin: no relevant lesions on visible extremities Neuro: No focal deficits.  Cranial nerves intact  ASSESSMENT:  1.  Constipation with obstipation 2.  Abdominal cramping secondary to constipation 3.  General medical problems 4.  Family history of colon cancer and personal history of nonadvanced adenoma.  Multiple prior colonoscopies.  Last examination 2018 with diverticulosis.  Aged out of surveillance  PLAN:  1.  Bowel purge with MiraLAX protocol 2.  Initiate MiraLAX 17 g daily thereafter.  Adjust as needed to achieve desired effect.  Reviewed. 3.  I have asked patient to contact the office in 2 weeks for a clinical follow-up.  Of course she can contact the office anytime for any questions or problems.  She understands. 4.  General medical care ongoing with Dr. Virgina Jock A total time of 45 minutes was spent.  See the patient, reviewing data, obtaining comprehensive history, performing medically appropriate physical examination, counseling and educating the patient regarding the above listed issues, finding  follow-up parameters, ordering medication, and documenting clinical information in the health record

## 2022-06-19 ENCOUNTER — Telehealth: Payer: Self-pay | Admitting: Internal Medicine

## 2022-06-19 NOTE — Telephone Encounter (Signed)
Inbound call from patient, states she has started her bowel purge and cannot keep the MiraLax down. Stated she has thrown up once from drinking it. Is requesting if there is any other alternative. Please advise.

## 2022-06-19 NOTE — Telephone Encounter (Signed)
Spoke with patient who stated she took the Dulcolax about 9:30am but instead of mixing 6-8 capfuls of Miralax in one container of liquid, she drank individual doses - 1 capful in 8 ounces of water, which she has done five times.  She was able to keep down all but about 5 ounces.  She states she has not had a significant bm.  Not sure if this is because of how she has drunk the Miralax and  I'm not sure what to tell her from here.  Please advise.

## 2022-06-19 NOTE — Telephone Encounter (Signed)
1.  She can try a bottle of magnesium citrate (if available) and if she has good results, then 1 dose of MiraLAX daily 2.  If no magnesium citrate, I recommend two Dulcolax tablets.  She can then resume MiraLAX.  She can mix this with Gatorade and drink slowly. 3.  If she feels that she needs nausea medicine, prescribe Zofran 4 mg p.o. every 4 hours as needed; #30; 1 refill. 4.  Make sure that she gives [place] follow-up on Monday. Thanks

## 2022-06-19 NOTE — Telephone Encounter (Signed)
Spoke with patient again and she stated she finally had a significant bm.  She is going to see how she does tomorrow.  If she feels she emptied her colon well, she will start daily Miralax.  If she feels she needs some extra help she will drink some Magnesium Citrate.  She will call Monday with an update

## 2022-06-22 ENCOUNTER — Ambulatory Visit
Admission: RE | Admit: 2022-06-22 | Discharge: 2022-06-22 | Disposition: A | Discharge status: Home / Self Care | Payer: Medicare Other | Source: Ambulatory Visit | Attending: Internal Medicine | Admitting: Internal Medicine

## 2022-06-22 DIAGNOSIS — E890 Postprocedural hypothyroidism: Secondary | ICD-10-CM | POA: Diagnosis not present

## 2022-06-22 DIAGNOSIS — E041 Nontoxic single thyroid nodule: Secondary | ICD-10-CM | POA: Diagnosis not present

## 2022-06-22 NOTE — Telephone Encounter (Signed)
Fyi.

## 2022-06-22 NOTE — Telephone Encounter (Signed)
Okay. At this point she should take 1 dose of MiraLAX daily.  If that ends up being too much, then 1 dose of MiraLAX every other day Have her give [place] follow-up in 1 week. Thanks

## 2022-06-22 NOTE — Telephone Encounter (Signed)
Patient is calling said since she spoke with you last she has been having loose stools so she does not need the medication that was discussed.

## 2022-06-23 DIAGNOSIS — M25621 Stiffness of right elbow, not elsewhere classified: Secondary | ICD-10-CM | POA: Diagnosis not present

## 2022-06-24 NOTE — Telephone Encounter (Signed)
Spoke to patient and relayed Dr. Blanch Media most recent recommendations.  She said daily Miralax is too much so she is going to try every other day.  She said it is very hard to get through to [place] to call with an update so she is going to [place] her next update.

## 2022-06-30 DIAGNOSIS — M25621 Stiffness of right elbow, not elsewhere classified: Secondary | ICD-10-CM | POA: Diagnosis not present

## 2022-07-03 ENCOUNTER — Ambulatory Visit
Admission: RE | Admit: 2022-07-03 | Discharge: 2022-07-03 | Disposition: A | Discharge status: Home / Self Care | Payer: Medicare Other | Source: Ambulatory Visit | Attending: Internal Medicine | Admitting: Internal Medicine

## 2022-07-03 DIAGNOSIS — Z1231 Encounter for screening mammogram for malignant neoplasm of breast: Secondary | ICD-10-CM | POA: Diagnosis not present

## 2022-07-07 DIAGNOSIS — M25621 Stiffness of right elbow, not elsewhere classified: Secondary | ICD-10-CM | POA: Diagnosis not present

## 2022-07-14 DIAGNOSIS — M25621 Stiffness of right elbow, not elsewhere classified: Secondary | ICD-10-CM | POA: Diagnosis not present

## 2022-07-23 DIAGNOSIS — M25521 Pain in right elbow: Secondary | ICD-10-CM | POA: Diagnosis not present

## 2022-07-28 DIAGNOSIS — M25621 Stiffness of right elbow, not elsewhere classified: Secondary | ICD-10-CM | POA: Diagnosis not present

## 2022-08-04 DIAGNOSIS — M25621 Stiffness of right elbow, not elsewhere classified: Secondary | ICD-10-CM | POA: Diagnosis not present

## 2022-08-05 DIAGNOSIS — N3946 Mixed incontinence: Secondary | ICD-10-CM | POA: Diagnosis not present

## 2022-08-05 DIAGNOSIS — R350 Frequency of micturition: Secondary | ICD-10-CM | POA: Diagnosis not present

## 2022-08-13 ENCOUNTER — Ambulatory Visit: Payer: Medicare Other | Admitting: Podiatry

## 2022-08-13 ENCOUNTER — Encounter: Payer: Self-pay | Admitting: Podiatry

## 2022-08-13 DIAGNOSIS — B351 Tinea unguium: Secondary | ICD-10-CM

## 2022-08-13 DIAGNOSIS — M79674 Pain in right toe(s): Secondary | ICD-10-CM | POA: Diagnosis not present

## 2022-08-13 DIAGNOSIS — L600 Ingrowing nail: Secondary | ICD-10-CM

## 2022-08-13 DIAGNOSIS — M79675 Pain in left toe(s): Secondary | ICD-10-CM

## 2022-08-13 DIAGNOSIS — M7989 Other specified soft tissue disorders: Secondary | ICD-10-CM

## 2022-08-13 NOTE — Progress Notes (Signed)
Subjective: Chief Complaint  Patient presents with   Toe Pain    Hallux right - lateral border, tender    82 year old female presents the office for above concerns.  She states the right big toenail is getting ingrown causing discomfort.  All of her nails are thick and she is difficulty trimming them.  No swelling redness or any drainage.  No open lesions.  She has had ongoing swelling to the legs.  No calf pain, chest pain, shortness of breath.  Objective: AAO x3, NAD DP/PT pulses palpable bilaterally, CRT less than 3 seconds Right hallux abductus noted.  Nails are hypertrophic, dystrophic with yellow, brown discoloration.  There is incurvation present to the right lateral nail border without any drainage or pus or erythema.  No ascending cellulitis.  No open lesions. No pain with calf compression, swelling, warmth, erythema  Assessment: Symptomatic onychomycosis, ingrown toenail; chronic lipedema  Plan: -All treatment options discussed with the patient including all alternatives, risks, complications.  -We discussed partial nail avulsion.  Ultimately decided to hold off on that and proceed with conservative treatment.  Show debrided all nails x 10 without any complications or bleeding.  After debrided the portion of the ingrown toenail the symptoms have resolved.  Monitoring signs or symptoms of infection. -I have ordered a venous reflux study for the right leg.  Monitor for any signs or symptoms of DVT.  Elevation.  Compression. -Patient encouraged to call the office with any questions, concerns, change in symptoms.   Vivi Barrack DPM

## 2022-08-14 DIAGNOSIS — M25621 Stiffness of right elbow, not elsewhere classified: Secondary | ICD-10-CM | POA: Diagnosis not present

## 2022-08-24 DIAGNOSIS — H52203 Unspecified astigmatism, bilateral: Secondary | ICD-10-CM | POA: Diagnosis not present

## 2022-08-24 DIAGNOSIS — H524 Presbyopia: Secondary | ICD-10-CM | POA: Diagnosis not present

## 2022-08-24 DIAGNOSIS — Z961 Presence of intraocular lens: Secondary | ICD-10-CM | POA: Diagnosis not present

## 2022-08-25 ENCOUNTER — Ambulatory Visit (HOSPITAL_COMMUNITY)
Admission: RE | Admit: 2022-08-25 | Discharge: 2022-08-25 | Disposition: A | Discharge status: Home / Self Care | Payer: Medicare Other | Source: Ambulatory Visit | Attending: Podiatry | Admitting: Podiatry

## 2022-08-25 DIAGNOSIS — M7989 Other specified soft tissue disorders: Secondary | ICD-10-CM | POA: Insufficient documentation

## 2022-08-25 DIAGNOSIS — M25621 Stiffness of right elbow, not elsewhere classified: Secondary | ICD-10-CM | POA: Diagnosis not present

## 2022-08-28 ENCOUNTER — Ambulatory Visit: Payer: Medicare Other | Admitting: Podiatry

## 2022-09-15 DIAGNOSIS — M25621 Stiffness of right elbow, not elsewhere classified: Secondary | ICD-10-CM | POA: Diagnosis not present

## 2022-09-19 DIAGNOSIS — H524 Presbyopia: Secondary | ICD-10-CM | POA: Diagnosis not present

## 2022-09-21 DIAGNOSIS — D0471 Carcinoma in situ of skin of right lower limb, including hip: Secondary | ICD-10-CM | POA: Diagnosis not present

## 2022-09-21 DIAGNOSIS — Z85820 Personal history of malignant melanoma of skin: Secondary | ICD-10-CM | POA: Diagnosis not present

## 2022-09-21 DIAGNOSIS — D1801 Hemangioma of skin and subcutaneous tissue: Secondary | ICD-10-CM | POA: Diagnosis not present

## 2022-09-21 DIAGNOSIS — D0439 Carcinoma in situ of skin of other parts of face: Secondary | ICD-10-CM | POA: Diagnosis not present

## 2022-09-21 DIAGNOSIS — L821 Other seborrheic keratosis: Secondary | ICD-10-CM | POA: Diagnosis not present

## 2022-09-28 DIAGNOSIS — R350 Frequency of micturition: Secondary | ICD-10-CM | POA: Diagnosis not present

## 2022-10-01 DIAGNOSIS — C44321 Squamous cell carcinoma of skin of nose: Secondary | ICD-10-CM | POA: Diagnosis not present

## 2022-10-06 DIAGNOSIS — M25621 Stiffness of right elbow, not elsewhere classified: Secondary | ICD-10-CM | POA: Diagnosis not present

## 2022-10-07 DIAGNOSIS — L570 Actinic keratosis: Secondary | ICD-10-CM | POA: Diagnosis not present

## 2022-10-09 DIAGNOSIS — N3946 Mixed incontinence: Secondary | ICD-10-CM | POA: Diagnosis not present

## 2022-10-12 ENCOUNTER — Encounter: Payer: Self-pay | Admitting: Surgery

## 2022-10-12 ENCOUNTER — Ambulatory Visit: Payer: Medicare Other | Admitting: Surgery

## 2022-10-12 VITALS — BP 150/87 | HR 61 | Temp 97.9°F | Resp 20 | Ht 63.0 in | Wt 134.0 lb

## 2022-10-12 DIAGNOSIS — I890 Lymphedema, not elsewhere classified: Secondary | ICD-10-CM | POA: Diagnosis not present

## 2022-10-12 DIAGNOSIS — M7989 Other specified soft tissue disorders: Secondary | ICD-10-CM

## 2022-10-12 NOTE — Progress Notes (Addendum)
[facility]  Patient name: Morgan Gregory MRN: [id number] DOB: 05-26-1940 Sex: female   REQUESTING PROVIDER:    Dr. Yetta Barre   REASON FOR CONSULT:    Leg swelling  HISTORY OF PRESENT ILLNESS:   Morgan Gregory is a 82 y.o. female, who is here today for evaluation of right leg swelling.  This dates back to the 1960s when she underwent a right calf melanoma excision with lymph node dissection in the groin.  She has been having issues with leg swelling since that time however this was further exacerbated when she underwent a knee replacement in 2019.  Finally, she feels that this past year the swelling has gotten worse.  She does occasionally wear compression socks.  She has regular activity.  She has noticed some discoloration and her legs.  This is very bothersome to her because that affects everything that she does.  She does not have any open wounds.  She denies a history of DVT   PAST MEDICAL HISTORY    Past Medical History:  Diagnosis Date   Arthritis    Cancer ([facility]) 1968   melanoma on right shin   Colon polyps    DDD (degenerative disc disease), cervical 03/29/2009   DG Cervical Spine Complete   Diverticulosis of sigmoid colon 01/2017   Edema    right lower leg/foot due to s/p melanoma excision   Gastritis    GERD (gastroesophageal reflux disease)    03-05-15 not an issue now   History of kidney stones    Hypothyroidism    IBS (irritable bowel syndrome)    Paresthesia    skin   Spondylosis 03/29/2009   DG Cervical Spine Complete   Thyroid disease    hypothyroid     FAMILY HISTORY   Family History  Problem Relation Age of Onset   Colon cancer Mother 25   Emphysema Father    Heart attack Brother 35   Esophageal cancer Neg Hx    Rectal cancer Neg Hx    Stomach cancer Neg Hx     SOCIAL HISTORY:   Social History   Socioeconomic History   Marital status: Widowed    Spouse name: Not on  file   Number of children: Not on file   Years of education: Not on file   Highest education level: Not on file  Occupational History   Occupation: retired  Tobacco Use   Smoking status: Former    Packs/day: 0.75    Years: 6.00    Additional pack years: 0.00    Total pack years: 4.50    Types: Cigarettes    Start date: 4    Quit date: 03/04/1965    Years since quitting: 57.6   Smokeless tobacco: Never  Vaping Use   Vaping Use: Never used  Substance and Sexual Activity   Alcohol use: Yes    Alcohol/week: 10.0 standard drinks of alcohol    Types: 10 Glasses of wine per week    Comment: wine 1 glass daily   Drug use: No   Sexual activity: Not Currently  Other Topics Concern   Not on file  Social History Narrative   11/12/21 Lives alone, widow, [facility].   3 step children.     Social Determinants of Health   Financial Resource Strain: Not on file  Food Insecurity: Not on file  Transportation Needs: Not on file  Physical Activity: Not on file  Stress: Not on file  Social Connections:  Not on file  Intimate Partner Violence: Not on file    ALLERGIES:    Allergies  Allergen Reactions   Augmentin [Amoxicillin-Pot Clavulanate] Diarrhea and Nausea Only    Has patient had a PCN reaction causing immediate rash, facial/tongue/throat swelling, SOB or lightheadedness with hypotension: No Has patient had a PCN reaction causing severe rash involving mucus membranes or skin necrosis: No Has patient had a PCN reaction that required hospitalization No Has patient had a PCN reaction occurring within the last 10 years: No If all of the above answers are "NO", then may proceed with Cephalosporin use.    Erythromycin Diarrhea and Nausea Only    Severe diarrhea    CURRENT MEDICATIONS:    Current Outpatient Medications  Medication Sig Dispense Refill   acetaminophen (TYLENOL) 325 MG tablet Take 650 mg by mouth 3 (three) times daily as needed. every six hours PRN in addition  to other pain medications     amitriptyline (ELAVIL) 25 MG tablet SMARTSIG:1 Tablet(s) By Mouth Every Evening     estradiol (ESTRACE) 0.1 MG/GM vaginal cream APPLY 1 GRAM(1/4 APPLICATOR) IN THE VAGINA TWICE WEEKLY     ibuprofen (ADVIL) 400 MG tablet Take 400 mg by mouth every 6 (six) hours as needed.     levothyroxine (SYNTHROID, LEVOTHROID) 50 MCG tablet Take 50 mcg by mouth daily.      magnesium (MAGTAB) 84 MG ( ) TBCR SR tablet Take 84 mg by mouth.     melatonin 5 MG TABS Take 5 mg by mouth at bedtime.     Multiple Vitamins-Minerals (EMERGEN-C VITAMIN C PO) Take by mouth. 1,000 mg; amt: 1 packet; oral Once A Morning     polyethylene glycol (MIRALAX / GLYCOLAX) 17 g packet Take 17 g by mouth 2 (two) times a week. Administer on Monday and Thursdays     senna-docusate (SENNA S) 8.6-50 MG tablet Take 1 tablet by mouth 2 (two) times daily as needed for mild constipation.     No current facility-administered medications for this visit.    REVIEW OF SYSTEMS:   [X]  denotes positive finding, [ ]  denotes negative finding Cardiac  Comments:  Chest pain or chest pressure:    Shortness of breath upon exertion:    Short of breath when lying flat:    Irregular heart rhythm:        Vascular    Pain in calf, thigh, or hip brought on by ambulation:    Pain in feet at night that wakes you up from your sleep:     Blood clot in your veins:    Leg swelling:  x       Pulmonary    Oxygen at home:    Productive cough:     Wheezing:         Neurologic    Sudden weakness in arms or legs:     Sudden numbness in arms or legs:     Sudden onset of difficulty speaking or slurred speech:    Temporary loss of vision in one eye:     Problems with dizziness:         Gastrointestinal    Blood in stool:      Vomited blood:         Genitourinary    Burning when urinating:     Blood in urine:        Psychiatric    Major depression:         Hematologic    Bleeding problems:  Problems with blood  clotting too easily:        Skin    Rashes or ulcers:        Constitutional    Fever or chills:     PHYSICAL EXAM:   There were no vitals filed for this visit.  GENERAL: The patient is a well-nourished female, in no acute distress. The vital signs are documented above. CARDIAC: There is a regular rate and rhythm.  VASCULAR: Prominent edema of the right lower leg and ankle that extends up to the midfoot.  Mild skin discoloration.  Saphenous vein was evaluated with the SonoSite.  It appears to be on the smaller side measuring 3-4 mm beginning in the proximal thigh PULMONARY: Nonlabored respirations MUSCULOSKELETAL: There are no major deformities or cyanosis. NEUROLOGIC: No focal weakness or paresthesias are detected. SKIN: There are no ulcers or rashes noted. PSYCHIATRIC: The patient has a normal affect.  STUDIES:   I have reviewed the following venous reflux study: Venous Reflux Times  +--------------+---------+------+-----------+------------+--------+  RIGHT        Reflux NoRefluxReflux TimeDiameter cmsComments                          Yes                                   +--------------+---------+------+-----------+------------+--------+  CFV          no                                              +--------------+---------+------+-----------+------------+--------+  FV mid        no                                              +--------------+---------+------+-----------+------------+--------+  Popliteal    no                                              +--------------+---------+------+-----------+------------+--------+  GSV at [facility]    no                            0.63              +--------------+---------+------+-----------+------------+--------+  GSV prox thighno                            0.45              +--------------+---------+------+-----------+------------+--------+  GSV mid thigh           yes    >500 ms       0.42              +--------------+---------+------+-----------+------------+--------+  GSV dist thigh          yes    >500 ms      0.38              +--------------+---------+------+-----------+------------+--------+  GSV at knee  yes    >500 ms      0.42              +--------------+---------+------+-----------+------------+--------+  GSV prox calf no                            0.35              +--------------+---------+------+-----------+------------+--------+  SSV Pop Fossa           yes    >500 ms      0.27              +--------------+---------+------+-----------+------------+--------+  SSV prox calf no                            0.24              +--------------+---------+------+-----------+------------+--------+   Summary:  Right:  - No evidence of deep vein thrombosis seen in the right lower extremity,  from the common femoral through the popliteal veins.  - No evidence of superficial venous thrombosis in the right lower  extremity.    - The deep venous system is competent.  - The great saphenous vein is incompetent.  - The small saphenous vein is incompetent.   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  I do not feel that her saphenous vein is large enough to receive any significant benefit from laser ablation.  I think we need to treat her symptomatically.  I am fitting her for 20-30 compression socks again today to make sure that she has the appropriate socks although she has been wearing compression for a long time.  I am also going to refer her to lymphedema therapy.  We talked about the importance of leg elevation.  Given that this has been going on for a long period of time, it is obvious that she has failed conservative measures such as compression stockings, leg elevation and exercise.  Duration time for Conservative  Measures-Exercise, Elevation and Compression tried for 4 weeks with no improvement.  She also has early hyperpigmentation.  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   Charlena Cross, MD, FACS [facility] [phone] Pager [phone]

## 2022-10-19 DIAGNOSIS — M25621 Stiffness of right elbow, not elsewhere classified: Secondary | ICD-10-CM | POA: Diagnosis not present

## 2022-10-28 DIAGNOSIS — K649 Unspecified hemorrhoids: Secondary | ICD-10-CM | POA: Diagnosis not present

## 2022-10-28 DIAGNOSIS — N3946 Mixed incontinence: Secondary | ICD-10-CM | POA: Diagnosis not present

## 2022-10-28 DIAGNOSIS — R8271 Bacteriuria: Secondary | ICD-10-CM | POA: Diagnosis not present

## 2022-10-28 DIAGNOSIS — N3000 Acute cystitis without hematuria: Secondary | ICD-10-CM | POA: Diagnosis not present

## 2022-11-03 ENCOUNTER — Telehealth: Payer: Self-pay | Admitting: Internal Medicine

## 2022-11-03 NOTE — Telephone Encounter (Signed)
Referral in [facility] for severe hemorrhoids.  Patient stated her PCP prescribed her medication and it is not working.  She said she is in severe pain.  Please advise scheduling?

## 2022-11-03 NOTE — Telephone Encounter (Signed)
Morgan Gregory is an acquaintance. She needs to get into be evaluated to see if this is truly hemorrhoid or fissure or something else. I have Zero openings until 9 AM August 7.  See if you can get her into see one of the advanced practitioners sooner.  Thanks

## 2022-11-03 NOTE — Telephone Encounter (Signed)
Pt scheduled to see Hyacinth Meeker PA 11/10/22 at 10:30am. Please let pt know about the appt.

## 2022-11-04 NOTE — Telephone Encounter (Signed)
[name] see note below regarding pts appt.

## 2022-11-04 NOTE — Telephone Encounter (Signed)
Inbound call from patient stating she will be able to come in for 7/23 appointment. Please advise, thank you.

## 2022-11-05 NOTE — Telephone Encounter (Signed)
Patient called requesting to speak with a nurse stated she needs to discuss the amount of Miralax to take because the last time was too much.

## 2022-11-05 NOTE — Telephone Encounter (Signed)
Pt states she needs a recommendation for miralax to help with constipation prior to her appt next week. Discussed with her that Dr. Marina Goodell recommends 1-3 doses daily to achieve BM. She will try this and knows if 3 doses winds up being to much to titrate the dose as needed.

## 2022-11-10 ENCOUNTER — Ambulatory Visit: Payer: Medicare Other | Admitting: Physician Assistant

## 2022-11-10 ENCOUNTER — Encounter: Payer: Self-pay | Admitting: Physician Assistant

## 2022-11-10 VITALS — BP 100/62 | HR 68 | Ht 63.0 in | Wt 130.4 lb

## 2022-11-10 DIAGNOSIS — K5900 Constipation, unspecified: Secondary | ICD-10-CM | POA: Diagnosis not present

## 2022-11-10 DIAGNOSIS — K641 Second degree hemorrhoids: Secondary | ICD-10-CM

## 2022-11-10 DIAGNOSIS — K644 Residual hemorrhoidal skin tags: Secondary | ICD-10-CM

## 2022-11-10 MED ORDER — HYDROCORTISONE (PERIANAL) 2.5 % EX CREA: 1.0000 | TOPICAL_CREAM | Freq: Two times a day (BID) | CUTANEOUS | 1 refills | Status: DC

## 2022-11-10 NOTE — Progress Notes (Signed)
Thanks for seeing [employer]

## 2022-11-10 NOTE — Progress Notes (Signed)
Chief Complaint: Rectal pain and hemorrhoids  HPI:    Morgan Gregory is an 82 year old female with a past medical history as listed below, including GERD, IBS and multiple others, known to Dr. Marina Goodell, who presents to clinic today to discuss rectal pain and hemorrhoids.    06/18/2022 office visit with Dr. Marina Goodell.  At that time discussed she had followed in the office for family history of colon cancer and personal history of adenomatous polyps and had undergone multiple colonoscopies with the last in 2018 with sigmoid diverticulosis.  At that time complaining of severe constipation.  She was given a bowel purge with MiraLAX protocol and started on MiraLAX daily afterward.    11/03/2022 patient referred for severe hemorrhoids.    Today, patient tells me that she is feeling much better over the past couple of days now after using Hydrocortisone ointment 2-3 times a day on her hemorrhoids.  Prior to all this was having a lot of bleeding and discomfort and was willing to have surgery but now things are 90% better.  She is still using Hydrocortisone ointment on the outside twice daily every day.  Denies any further rectal bleeding.    Also continues with constipation.  Tells me that this extreme trouble with hemorrhoids came when she had Botox for her bladder and was straining to urinate.  As far as constipation she is titrating her MiraLAX well and seems to be controlling things.    Denies fever, chills or weight loss.  Past Medical History:  Diagnosis Date   Arthritis    Cancer (HCC) 1968   melanoma on right shin   Colon polyps    DDD (degenerative disc disease), cervical 03/29/2009   DG Cervical Spine Complete   Diverticulosis of sigmoid colon 01/2017   Edema    right lower leg/foot due to s/p melanoma excision   Gastritis    GERD (gastroesophageal reflux disease)    03-05-15 not an issue now   History of kidney stones    Hypothyroidism    IBS (irritable bowel syndrome)    Paresthesia     skin   Spondylosis 03/29/2009   DG Cervical Spine Complete   Thyroid disease    hypothyroid    Past Surgical History:  Procedure Laterality Date   ABDOMINAL HYSTERECTOMY  1979   CATARACT EXTRACTION, BILATERAL     8'15,. 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 every six hours PRN in addition to other pain medications     ibuprofen (ADVIL) 400 MG tablet Take 400 mg by mouth every 6 (six) hours as needed.     levothyroxine (SYNTHROID, LEVOTHROID) 50 MCG tablet Take 50 mcg by mouth daily.  magnesium (MAGTAB) 84 MG ( ) TBCR SR tablet Take 84 mg by mouth.     melatonin 5 MG TABS Take 5 mg by mouth at bedtime.     Multiple Vitamins-Minerals (EMERGEN-C VITAMIN C PO) Take by mouth. 1,000 mg; amt: 1 packet; oral Once A Morning     polyethylene glycol (MIRALAX / GLYCOLAX) 17 g packet Take 17 g by mouth 2 (two) times a week. Administer on Monday and Thursdays     senna-docusate (SENNA S) 8.6-50 MG tablet Take 1 tablet by mouth 2 (two) times daily as needed for mild constipation.     No current facility-administered medications for this visit.    Allergies as of 11/10/2022 - Review Complete 11/10/2022  Allergen Reaction  Noted   Augmentin [amoxicillin-pot clavulanate] Diarrhea and Nausea Only 10/19/2011   Erythromycin Diarrhea and Nausea Only 10/19/2011    Family History  Problem Relation Age of Onset   Colon cancer Mother 9   Emphysema Father    Heart attack Brother 48   Esophageal cancer Neg Hx    Rectal cancer Neg Hx    Stomach cancer Neg Hx     Social History   Socioeconomic History   Marital status: Widowed    Spouse name: Not on file   Number of children: Not on file   Years of education: Not on file   Highest education level: Not on file  Occupational History   Occupation: retired  Tobacco Use   Smoking status: Former    Current packs/day: 0.00    Average packs/day: 0.8 packs/day for 6.9 years (5.2 ttl pk-yrs)    Types: Cigarettes    Start date: 36    Quit date: 03/04/1965    Years since quitting: 57.7   Smokeless tobacco: Never  Vaping Use   Vaping status: Never Used  Substance and Sexual Activity   Alcohol use: Yes    Alcohol/week: 10.0 standard drinks of alcohol    Types: 10 Glasses of wine per week    Comment: wine 1 glass daily   Drug use: No   Sexual activity: Not Currently  Other Topics Concern   Not on file  Social History Narrative   11/12/21 Lives alone, widow, [facility].   3 step children.     Social Determinants of Health   Financial Resource Strain: Not on file  Food Insecurity: Not on file  Transportation Needs: Not on file  Physical Activity: Not on file  Stress: Not on file  Social Connections: Not on file  Intimate Partner Violence: Not on file    Review of Systems:    Constitutional: No weight loss, fever or chills Cardiovascular: No chest pain   Respiratory: No SOB  Gastrointestinal: See HPI and otherwise negative   Physical Exam:  Vital signs: BP 100/62   Pulse 68   Ht 5\' 3"  (1.6 m)   Wt 130 lb 6.4 oz (59.1 kg)   SpO2 99%   BMI 23.10 kg/m    Constitutional:   Pleasant elderly Caucasian female appears to be in NAD, Well  developed, Well nourished, alert and cooperative Respiratory: Respirations even and unlabored. Lungs clear to auscultation bilaterally.   No wheezes, crackles, or rhonchi.  Cardiovascular: Normal S1, S2. No MRG. Regular rate and rhythm. No peripheral edema, cyanosis or pallor.  Gastrointestinal:  Soft, nondistended, nontender. No rebound or guarding. Normal bowel sounds. No appreciable masses or hepatomegaly. Rectal:  External: multiple external hemorrhoids and skin tags, nonthrombosed, nontender; internal: Some fullness, tight sphincter tone;  and anoscopy: Grade 2 internal hemorrhoids, no fissure Psychiatric: Oriented to person, place and time. Demonstrates good judgement and reason without abnormal affect or behaviors.  No recent labs or imaging.  Assessment: 1.  Internal and external hemorrhoids: These have improved 90% with Hydrocortisone ointment over the past 2 weeks per the patient, internal hemorrhoids are still somewhat inflamed 2.  Rectal bleeding: With above 3.  Constipation: Thought related to medications and age  Plan: 1.  Discussed titration of patient's MiraLAX again.  Recommend she stay on this once daily indefinitely and only decrease if she is having diarrhea and then moved to a half a dose a day.  We discussed titration of this in detail. 2.  For now continue Hydrocortisone ointment, but placed on a Preparation H suppository twice daily for the next week. 3.  Patient to follow in clinic as needed with [place].  Morgan Meeker, PA-C [facility] 11/10/2022, 10:39 AM  Cc: Creola Corn, MD

## 2022-11-10 NOTE — Patient Instructions (Addendum)
Start Miralax 1 capful daily in 8 ounces of liquid, unless diarrhea then decrease to 1/2 capful daily.  Please purchase the following medications over the counter and take as directed: Preparation H suppository - apply a small amount of Hydrocortisone cream twice daily for 7 days.   _______________________________________________________  If your blood pressure at your visit was 140/90 or greater, please contact your primary care physician to follow up on this.  _______________________________________________________  If you are age 37 or older, your body mass index should be between 23-30. Your Body mass index is 23.1 kg/m. If this is out of the aforementioned range listed, please consider follow up with your Primary Care Provider.  If you are age 37 or younger, your body mass index should be between 19-25. Your Body mass index is 23.1 kg/m. If this is out of the aformentioned range listed, please consider follow up with your Primary Care Provider.   ________________________________________________________  The [facility] providers would like to encourage you to use [facility] to communicate with providers for non-urgent requests or questions.  Due to long hold times on the telephone, sending your provider a message by [facility] may be a faster and more efficient way to get a response.  Please allow 48 business hours for a response.  Please remember that this is for non-urgent requests.  _______________________________________________________

## 2022-11-12 DIAGNOSIS — M25621 Stiffness of right elbow, not elsewhere classified: Secondary | ICD-10-CM | POA: Diagnosis not present

## 2022-11-18 DIAGNOSIS — M25641 Stiffness of right hand, not elsewhere classified: Secondary | ICD-10-CM | POA: Diagnosis not present

## 2022-11-18 DIAGNOSIS — Z4789 Encounter for other orthopedic aftercare: Secondary | ICD-10-CM | POA: Diagnosis not present

## 2022-11-18 DIAGNOSIS — S52021A Displaced fracture of olecranon process without intraarticular extension of right ulna, initial encounter for closed fracture: Secondary | ICD-10-CM | POA: Diagnosis not present

## 2022-11-18 DIAGNOSIS — M79641 Pain in right hand: Secondary | ICD-10-CM | POA: Diagnosis not present

## 2022-12-10 DIAGNOSIS — M810 Age-related osteoporosis without current pathological fracture: Secondary | ICD-10-CM | POA: Diagnosis not present

## 2022-12-10 DIAGNOSIS — E039 Hypothyroidism, unspecified: Secondary | ICD-10-CM | POA: Diagnosis not present

## 2022-12-10 DIAGNOSIS — R32 Unspecified urinary incontinence: Secondary | ICD-10-CM | POA: Diagnosis not present

## 2022-12-14 DIAGNOSIS — K080 Exfoliation of teeth due to systemic causes: Secondary | ICD-10-CM | POA: Diagnosis not present

## 2022-12-17 DIAGNOSIS — K080 Exfoliation of teeth due to systemic causes: Secondary | ICD-10-CM | POA: Diagnosis not present

## 2023-03-09 ENCOUNTER — Ambulatory Visit: Payer: Medicare Other | Admitting: Internal Medicine

## 2023-03-09 ENCOUNTER — Encounter: Payer: Self-pay | Admitting: Internal Medicine

## 2023-03-09 VITALS — BP 128/66 | HR 64 | Ht 62.5 in | Wt 138.5 lb

## 2023-03-09 DIAGNOSIS — K5900 Constipation, unspecified: Secondary | ICD-10-CM | POA: Diagnosis not present

## 2023-03-09 NOTE — Progress Notes (Signed)
HISTORY OF PRESENT ILLNESS:  Morgan Gregory is a 82 y.o. female with chronic constipation who I saw in the office June 18, 2022 with constipation associated with obstipation and abdominal cramping.  See that dictation.  She underwent bowel purge and was placed on regular MiraLAX therapy.  She was subsequently seen by the GI physician assistant November 10, 2022 regarding symptomatic hemorrhoids which were improving with hydrocortisone ointment.  She scheduled this follow-up today to check in with me.  She tells me that she is taking 1 dose of MiraLAX daily.  She is having regular bowel movements.  No problems with hemorrhoids.  She wonders if it is safe for advisable to continue on MiraLAX.  Having no side effects.  No new problems or complaints.  Last colonoscopy October 2018.  She has aged out of surveillance.  REVIEW OF SYSTEMS:  All non-GI ROS negative except for arthritis, itching, sleeping problems, ankle edema, urinary leakage, urinary frequency, visual change, shortness of breath  Past Medical History:  Diagnosis Date   Arthritis    Cancer (HCC) 1968   melanoma on right shin   Colon polyps    DDD (degenerative disc disease), cervical 03/29/2009   DG Cervical Spine Complete   Diverticulosis of sigmoid colon 01/2017   Edema    right lower leg/foot due to s/p melanoma excision   Fracture    elbow fx   Gastritis    GERD (gastroesophageal reflux disease)    03-05-15 not an issue now   History of kidney stones    Hypothyroidism    IBS (irritable bowel syndrome)    Paresthesia    skin   Spondylosis 03/29/2009   DG Cervical Spine Complete   Thyroid disease    hypothyroid    Past Surgical History:  Procedure Laterality Date   ABDOMINAL HYSTERECTOMY  1979   CATARACT EXTRACTION, BILATERAL     8'15,. 9'15   CERVICAL SPINE SURGERY  2007   posterior approach-no retained hardware   COLONOSCOPY     FOOT SURGERY Bilateral    hammertoe, neuromas, bunionectomy   MELANOMA  EXCISION  1968   right shin/calf- calf measures 0.75 inches larger than left   THYROIDECTOMY, PARTIAL  2011   TONSILLECTOMY     TOTAL KNEE ARTHROPLASTY Left 03/19/2015   Procedure: LEFT TOTAL KNEE ARTHROPLASTY;  Surgeon: Durene Romans, MD;  Location: [facility];  Service: Orthopedics;  Laterality: Left;   TOTAL KNEE ARTHROPLASTY Right 04/27/2017   Procedure: RIGHT TOTAL KNEE ARTHROPLASTY;  Surgeon: Durene Romans, MD;  Location: [facility];  Service: Orthopedics;  Laterality: Right;  70 mins    Social History Morgan Gregory  reports that she quit smoking about 58 years ago. Her smoking use included cigarettes. She started smoking about 64 years ago. She has a 5.2 pack-year smoking history. She has never used smokeless tobacco. She reports current alcohol use of about 10.0 standard drinks of alcohol per week. She reports that she does not use drugs.  family history includes Colon cancer (age of onset: 61) in her mother; Emphysema in her father; Heart attack (age of onset: 55) in her brother.  Allergies  Allergen Reactions   Augmentin [Amoxicillin-Pot Clavulanate] Diarrhea and Nausea Only    Has patient had a PCN reaction causing immediate rash, facial/tongue/throat swelling, SOB or lightheadedness with hypotension: No Has patient had a PCN reaction causing severe rash involving mucus membranes or skin necrosis: No Has patient had a PCN reaction that required hospitalization No Has patient had  a PCN reaction occurring within the last 10 years: No If all of the above answers are "NO", then may proceed with Cephalosporin use.    Erythromycin Diarrhea and Nausea Only    Severe diarrhea       PHYSICAL EXAMINATION: Vital signs: BP 128/66 (BP Location: Left Arm, Patient Position: Sitting, Cuff Size: Normal)   Pulse 64   Ht 5' 2.5" (1.588 m) Comment: height measured without shoes  Wt 138 lb 8 oz (62.8 kg)   BMI 24.93 kg/m  General: Well-developed, well-nourished, no acute distress HEENT:  Anicteric Abdomen: Not reexamined. Extremities: No visible abnormalities Psychiatric: alert and oriented x3. Cooperative   ASSESSMENT:  1.  Chronic constipation.  Addressed with MiraLAX daily. 2.  Symptomatic hemorrhoids.  Improved on hydrocortisone ointment.   PLAN:  1.  Continue MiraLAX.  Titrate to need 2.  Follow-up as needed

## 2023-03-09 NOTE — Patient Instructions (Signed)
Continue your Miralax and follow up as needed.  _______________________________________________________  If your blood pressure at your visit was 140/90 or greater, please contact your primary care physician to follow up on this.  _______________________________________________________  If you are age 84 or older, your body mass index should be between 23-30. Your Body mass index is 24.93 kg/m. If this is out of the aforementioned range listed, please consider follow up with your Primary Care Provider.  If you are age 47 or younger, your body mass index should be between 19-25. Your Body mass index is 24.93 kg/m. If this is out of the aformentioned range listed, please consider follow up with your Primary Care Provider.   ________________________________________________________  The [place] [facility] providers would like to encourage you to use [facility] to communicate with providers for non-urgent requests or questions.  Due to long hold times on the telephone, sending your provider a message by [facility] may be a faster and more efficient way to get a response.  Please allow 48 business hours for a response.  Please remember that this is for non-urgent requests.  _______________________________________________________

## 2023-03-24 DIAGNOSIS — Z85820 Personal history of malignant melanoma of skin: Secondary | ICD-10-CM | POA: Diagnosis not present

## 2023-03-24 DIAGNOSIS — D225 Melanocytic nevi of trunk: Secondary | ICD-10-CM | POA: Diagnosis not present

## 2023-03-24 DIAGNOSIS — L821 Other seborrheic keratosis: Secondary | ICD-10-CM | POA: Diagnosis not present

## 2023-03-24 DIAGNOSIS — Z85828 Personal history of other malignant neoplasm of skin: Secondary | ICD-10-CM | POA: Diagnosis not present

## 2023-05-04 DIAGNOSIS — J069 Acute upper respiratory infection, unspecified: Secondary | ICD-10-CM | POA: Diagnosis not present

## 2023-05-04 DIAGNOSIS — R051 Acute cough: Secondary | ICD-10-CM | POA: Diagnosis not present

## 2023-05-18 ENCOUNTER — Telehealth: Payer: Self-pay | Admitting: Internal Medicine

## 2023-05-18 DIAGNOSIS — M810 Age-related osteoporosis without current pathological fracture: Secondary | ICD-10-CM | POA: Diagnosis not present

## 2023-05-18 NOTE — Telephone Encounter (Signed)
[facility] office called requesting OV notes to be sent over to their fax at [phone] from 03/09/2023

## 2023-05-18 NOTE — Telephone Encounter (Signed)
Note faxed as requested through epic.

## 2023-05-24 ENCOUNTER — Other Ambulatory Visit: Payer: Self-pay | Admitting: Internal Medicine

## 2023-05-24 DIAGNOSIS — Z1231 Encounter for screening mammogram for malignant neoplasm of breast: Secondary | ICD-10-CM

## 2023-06-14 ENCOUNTER — Other Ambulatory Visit (HOSPITAL_COMMUNITY): Payer: Self-pay | Admitting: *Deleted

## 2023-06-15 ENCOUNTER — Ambulatory Visit (HOSPITAL_COMMUNITY)
Admission: RE | Admit: 2023-06-15 | Discharge: 2023-06-15 | Disposition: A | Discharge status: Home / Self Care | Payer: Medicare Other | Source: Ambulatory Visit | Attending: Internal Medicine | Admitting: Internal Medicine

## 2023-06-15 DIAGNOSIS — Z7983 Long term (current) use of bisphosphonates: Secondary | ICD-10-CM | POA: Diagnosis not present

## 2023-06-15 DIAGNOSIS — M810 Age-related osteoporosis without current pathological fracture: Secondary | ICD-10-CM | POA: Insufficient documentation

## 2023-06-15 MED ORDER — ZOLEDRONIC ACID 5 MG/100ML IV SOLN
INTRAVENOUS | Status: AC
  Administered 2023-06-15: 5 mg via INTRAVENOUS
  Filled 2023-06-15: qty 100

## 2023-06-15 MED ORDER — ZOLEDRONIC ACID 5 MG/100ML IV SOLN: 5.0000 mg | Freq: Once | INTRAVENOUS | Status: AC

## 2023-06-17 DIAGNOSIS — E039 Hypothyroidism, unspecified: Secondary | ICD-10-CM | POA: Diagnosis not present

## 2023-06-17 DIAGNOSIS — M810 Age-related osteoporosis without current pathological fracture: Secondary | ICD-10-CM | POA: Diagnosis not present

## 2023-06-24 DIAGNOSIS — Z0000 Encounter for general adult medical examination without abnormal findings: Secondary | ICD-10-CM | POA: Diagnosis not present

## 2023-06-24 DIAGNOSIS — Z1331 Encounter for screening for depression: Secondary | ICD-10-CM | POA: Diagnosis not present

## 2023-06-24 DIAGNOSIS — C439 Malignant melanoma of skin, unspecified: Secondary | ICD-10-CM | POA: Diagnosis not present

## 2023-06-24 DIAGNOSIS — K080 Exfoliation of teeth due to systemic causes: Secondary | ICD-10-CM | POA: Diagnosis not present

## 2023-06-24 DIAGNOSIS — Z1339 Encounter for screening examination for other mental health and behavioral disorders: Secondary | ICD-10-CM | POA: Diagnosis not present

## 2023-06-24 DIAGNOSIS — Z23 Encounter for immunization: Secondary | ICD-10-CM | POA: Diagnosis not present

## 2023-07-06 ENCOUNTER — Ambulatory Visit
Admission: RE | Admit: 2023-07-06 | Discharge: 2023-07-06 | Disposition: A | Discharge status: Home / Self Care | Source: Ambulatory Visit | Attending: Internal Medicine | Admitting: Internal Medicine

## 2023-07-06 ENCOUNTER — Ambulatory Visit: Payer: Medicare Other

## 2023-07-06 DIAGNOSIS — Z1231 Encounter for screening mammogram for malignant neoplasm of breast: Secondary | ICD-10-CM

## 2023-09-29 DIAGNOSIS — Z85820 Personal history of malignant melanoma of skin: Secondary | ICD-10-CM | POA: Diagnosis not present

## 2023-09-29 DIAGNOSIS — D225 Melanocytic nevi of trunk: Secondary | ICD-10-CM | POA: Diagnosis not present

## 2023-09-29 DIAGNOSIS — L812 Freckles: Secondary | ICD-10-CM | POA: Diagnosis not present

## 2023-09-29 DIAGNOSIS — L821 Other seborrheic keratosis: Secondary | ICD-10-CM | POA: Diagnosis not present

## 2023-10-08 DIAGNOSIS — H52203 Unspecified astigmatism, bilateral: Secondary | ICD-10-CM | POA: Diagnosis not present

## 2023-10-25 ENCOUNTER — Other Ambulatory Visit (HOSPITAL_BASED_OUTPATIENT_CLINIC_OR_DEPARTMENT_OTHER): Payer: Self-pay

## 2023-10-25 DIAGNOSIS — M25572 Pain in left ankle and joints of left foot: Secondary | ICD-10-CM | POA: Diagnosis not present

## 2023-10-25 MED ORDER — ALENDRONATE SODIUM 70 MG PO TABS: 70.0000 mg | ORAL_TABLET | ORAL | 3 refills | Status: AC

## 2023-10-25 MED ORDER — MELOXICAM 7.5 MG PO TABS
7.5000 mg | ORAL_TABLET | Freq: Every day | ORAL | 0 refills | Status: AC
  Filled 2023-10-25: qty 10, 10d supply, fill #0

## 2023-10-25 MED ORDER — ALENDRONATE SODIUM 70 MG PO TABS
70.0000 mg | ORAL_TABLET | ORAL | 3 refills | Status: AC
  Filled 2023-11-23: qty 12, 84d supply, fill #0

## 2023-10-25 MED ORDER — HYDROCORTISONE (PERIANAL) 2.5 % EX CREA: TOPICAL_CREAM | CUTANEOUS | 1 refills | Status: AC

## 2023-10-25 MED ORDER — LEVOTHYROXINE SODIUM 50 MCG PO TABS
50.0000 ug | ORAL_TABLET | Freq: Every morning | ORAL | 3 refills | Status: AC
  Filled 2023-11-23 (×2): qty 90, 90d supply, fill #0

## 2023-10-26 ENCOUNTER — Other Ambulatory Visit (HOSPITAL_COMMUNITY): Payer: Self-pay | Admitting: Medical

## 2023-10-26 ENCOUNTER — Ambulatory Visit (HOSPITAL_COMMUNITY)
Admission: RE | Admit: 2023-10-26 | Discharge: 2023-10-26 | Disposition: A | Discharge status: Home / Self Care | Source: Ambulatory Visit | Attending: Vascular Surgery | Admitting: Vascular Surgery

## 2023-10-26 DIAGNOSIS — M25572 Pain in left ankle and joints of left foot: Secondary | ICD-10-CM | POA: Insufficient documentation

## 2023-11-04 DIAGNOSIS — M25572 Pain in left ankle and joints of left foot: Secondary | ICD-10-CM | POA: Diagnosis not present

## 2023-11-05 ENCOUNTER — Encounter: Payer: Self-pay | Admitting: Advanced Practice Midwife

## 2023-11-10 DIAGNOSIS — M79672 Pain in left foot: Secondary | ICD-10-CM | POA: Diagnosis not present

## 2023-11-10 DIAGNOSIS — I890 Lymphedema, not elsewhere classified: Secondary | ICD-10-CM | POA: Diagnosis not present

## 2023-11-10 DIAGNOSIS — M76822 Posterior tibial tendinitis, left leg: Secondary | ICD-10-CM | POA: Diagnosis not present

## 2023-11-23 ENCOUNTER — Other Ambulatory Visit (HOSPITAL_BASED_OUTPATIENT_CLINIC_OR_DEPARTMENT_OTHER): Payer: Self-pay

## 2023-11-25 ENCOUNTER — Other Ambulatory Visit (HOSPITAL_BASED_OUTPATIENT_CLINIC_OR_DEPARTMENT_OTHER): Payer: Self-pay

## 2023-11-25 DIAGNOSIS — N390 Urinary tract infection, site not specified: Secondary | ICD-10-CM | POA: Diagnosis not present

## 2023-11-25 DIAGNOSIS — R3915 Urgency of urination: Secondary | ICD-10-CM | POA: Diagnosis not present

## 2023-11-25 MED ORDER — NITROFURANTOIN MONOHYD MACRO 100 MG PO CAPS
100.0000 mg | ORAL_CAPSULE | Freq: Two times a day (BID) | ORAL | 0 refills | Status: AC
  Filled 2023-11-25: qty 14, 7d supply, fill #0

## 2023-12-14 ENCOUNTER — Other Ambulatory Visit (HOSPITAL_BASED_OUTPATIENT_CLINIC_OR_DEPARTMENT_OTHER): Payer: Self-pay

## 2023-12-22 ENCOUNTER — Other Ambulatory Visit (HOSPITAL_BASED_OUTPATIENT_CLINIC_OR_DEPARTMENT_OTHER): Payer: Self-pay

## 2023-12-22 DIAGNOSIS — M76822 Posterior tibial tendinitis, left leg: Secondary | ICD-10-CM | POA: Diagnosis not present

## 2023-12-22 MED ORDER — CELECOXIB 100 MG PO CAPS
100.0000 mg | ORAL_CAPSULE | Freq: Every day | ORAL | 3 refills | Status: AC
  Filled 2023-12-22: qty 30, 30d supply, fill #0

## 2023-12-23 DIAGNOSIS — M25572 Pain in left ankle and joints of left foot: Secondary | ICD-10-CM | POA: Diagnosis not present

## 2023-12-23 DIAGNOSIS — M76822 Posterior tibial tendinitis, left leg: Secondary | ICD-10-CM | POA: Diagnosis not present

## 2023-12-23 DIAGNOSIS — M25472 Effusion, left ankle: Secondary | ICD-10-CM | POA: Diagnosis not present

## 2023-12-24 ENCOUNTER — Other Ambulatory Visit (HOSPITAL_BASED_OUTPATIENT_CLINIC_OR_DEPARTMENT_OTHER): Payer: Self-pay

## 2023-12-28 DIAGNOSIS — M25472 Effusion, left ankle: Secondary | ICD-10-CM | POA: Diagnosis not present

## 2023-12-28 DIAGNOSIS — M25572 Pain in left ankle and joints of left foot: Secondary | ICD-10-CM | POA: Diagnosis not present

## 2023-12-28 DIAGNOSIS — M76822 Posterior tibial tendinitis, left leg: Secondary | ICD-10-CM | POA: Diagnosis not present

## 2024-01-04 DIAGNOSIS — M25572 Pain in left ankle and joints of left foot: Secondary | ICD-10-CM | POA: Diagnosis not present

## 2024-01-04 DIAGNOSIS — K080 Exfoliation of teeth due to systemic causes: Secondary | ICD-10-CM | POA: Diagnosis not present

## 2024-01-04 DIAGNOSIS — M25472 Effusion, left ankle: Secondary | ICD-10-CM | POA: Diagnosis not present

## 2024-01-04 DIAGNOSIS — M76822 Posterior tibial tendinitis, left leg: Secondary | ICD-10-CM | POA: Diagnosis not present

## 2024-01-06 DIAGNOSIS — M76822 Posterior tibial tendinitis, left leg: Secondary | ICD-10-CM | POA: Diagnosis not present

## 2024-01-06 DIAGNOSIS — M25472 Effusion, left ankle: Secondary | ICD-10-CM | POA: Diagnosis not present

## 2024-01-06 DIAGNOSIS — M25572 Pain in left ankle and joints of left foot: Secondary | ICD-10-CM | POA: Diagnosis not present

## 2024-01-11 DIAGNOSIS — M25472 Effusion, left ankle: Secondary | ICD-10-CM | POA: Diagnosis not present

## 2024-01-11 DIAGNOSIS — M25572 Pain in left ankle and joints of left foot: Secondary | ICD-10-CM | POA: Diagnosis not present

## 2024-01-11 DIAGNOSIS — M76822 Posterior tibial tendinitis, left leg: Secondary | ICD-10-CM | POA: Diagnosis not present

## 2024-01-13 DIAGNOSIS — C439 Malignant melanoma of skin, unspecified: Secondary | ICD-10-CM | POA: Diagnosis not present

## 2024-01-13 DIAGNOSIS — M76822 Posterior tibial tendinitis, left leg: Secondary | ICD-10-CM | POA: Diagnosis not present

## 2024-01-13 DIAGNOSIS — K219 Gastro-esophageal reflux disease without esophagitis: Secondary | ICD-10-CM | POA: Diagnosis not present

## 2024-01-13 DIAGNOSIS — Z23 Encounter for immunization: Secondary | ICD-10-CM | POA: Diagnosis not present

## 2024-01-13 DIAGNOSIS — M25472 Effusion, left ankle: Secondary | ICD-10-CM | POA: Diagnosis not present

## 2024-01-13 DIAGNOSIS — M25572 Pain in left ankle and joints of left foot: Secondary | ICD-10-CM | POA: Diagnosis not present

## 2024-01-13 DIAGNOSIS — E039 Hypothyroidism, unspecified: Secondary | ICD-10-CM | POA: Diagnosis not present

## 2024-01-17 ENCOUNTER — Other Ambulatory Visit (HOSPITAL_BASED_OUTPATIENT_CLINIC_OR_DEPARTMENT_OTHER): Payer: Self-pay

## 2024-01-17 MED ORDER — LEVOTHYROXINE SODIUM 50 MCG PO TABS
50.0000 ug | ORAL_TABLET | Freq: Every morning | ORAL | 3 refills | Status: AC
  Filled 2024-02-03: qty 90, 90d supply, fill #0

## 2024-01-18 ENCOUNTER — Other Ambulatory Visit (HOSPITAL_BASED_OUTPATIENT_CLINIC_OR_DEPARTMENT_OTHER): Payer: Self-pay

## 2024-01-18 DIAGNOSIS — M25572 Pain in left ankle and joints of left foot: Secondary | ICD-10-CM | POA: Diagnosis not present

## 2024-01-18 DIAGNOSIS — M25472 Effusion, left ankle: Secondary | ICD-10-CM | POA: Diagnosis not present

## 2024-01-18 DIAGNOSIS — M76822 Posterior tibial tendinitis, left leg: Secondary | ICD-10-CM | POA: Diagnosis not present

## 2024-01-25 DIAGNOSIS — M25572 Pain in left ankle and joints of left foot: Secondary | ICD-10-CM | POA: Diagnosis not present

## 2024-01-25 DIAGNOSIS — M76822 Posterior tibial tendinitis, left leg: Secondary | ICD-10-CM | POA: Diagnosis not present

## 2024-01-25 DIAGNOSIS — M25472 Effusion, left ankle: Secondary | ICD-10-CM | POA: Diagnosis not present

## 2024-01-27 DIAGNOSIS — M76822 Posterior tibial tendinitis, left leg: Secondary | ICD-10-CM | POA: Diagnosis not present

## 2024-01-27 DIAGNOSIS — M25572 Pain in left ankle and joints of left foot: Secondary | ICD-10-CM | POA: Diagnosis not present

## 2024-01-27 DIAGNOSIS — M25472 Effusion, left ankle: Secondary | ICD-10-CM | POA: Diagnosis not present

## 2024-02-01 DIAGNOSIS — M25472 Effusion, left ankle: Secondary | ICD-10-CM | POA: Diagnosis not present

## 2024-02-01 DIAGNOSIS — M25572 Pain in left ankle and joints of left foot: Secondary | ICD-10-CM | POA: Diagnosis not present

## 2024-02-01 DIAGNOSIS — M76822 Posterior tibial tendinitis, left leg: Secondary | ICD-10-CM | POA: Diagnosis not present

## 2024-02-02 DIAGNOSIS — M76822 Posterior tibial tendinitis, left leg: Secondary | ICD-10-CM | POA: Diagnosis not present

## 2024-02-03 ENCOUNTER — Other Ambulatory Visit (HOSPITAL_BASED_OUTPATIENT_CLINIC_OR_DEPARTMENT_OTHER): Payer: Self-pay

## 2024-02-04 DIAGNOSIS — M25472 Effusion, left ankle: Secondary | ICD-10-CM | POA: Diagnosis not present

## 2024-02-04 DIAGNOSIS — M25572 Pain in left ankle and joints of left foot: Secondary | ICD-10-CM | POA: Diagnosis not present

## 2024-02-04 DIAGNOSIS — M76822 Posterior tibial tendinitis, left leg: Secondary | ICD-10-CM | POA: Diagnosis not present

## 2024-02-08 DIAGNOSIS — M76822 Posterior tibial tendinitis, left leg: Secondary | ICD-10-CM | POA: Diagnosis not present

## 2024-02-08 DIAGNOSIS — M25472 Effusion, left ankle: Secondary | ICD-10-CM | POA: Diagnosis not present

## 2024-02-08 DIAGNOSIS — M25572 Pain in left ankle and joints of left foot: Secondary | ICD-10-CM | POA: Diagnosis not present

## 2024-02-10 DIAGNOSIS — M25572 Pain in left ankle and joints of left foot: Secondary | ICD-10-CM | POA: Diagnosis not present

## 2024-02-10 DIAGNOSIS — M25472 Effusion, left ankle: Secondary | ICD-10-CM | POA: Diagnosis not present

## 2024-02-10 DIAGNOSIS — M76822 Posterior tibial tendinitis, left leg: Secondary | ICD-10-CM | POA: Diagnosis not present

## 2024-02-15 DIAGNOSIS — M76822 Posterior tibial tendinitis, left leg: Secondary | ICD-10-CM | POA: Diagnosis not present

## 2024-02-15 DIAGNOSIS — M25572 Pain in left ankle and joints of left foot: Secondary | ICD-10-CM | POA: Diagnosis not present

## 2024-02-15 DIAGNOSIS — M25472 Effusion, left ankle: Secondary | ICD-10-CM | POA: Diagnosis not present

## 2024-02-17 DIAGNOSIS — M25572 Pain in left ankle and joints of left foot: Secondary | ICD-10-CM | POA: Diagnosis not present

## 2024-02-17 DIAGNOSIS — M76822 Posterior tibial tendinitis, left leg: Secondary | ICD-10-CM | POA: Diagnosis not present

## 2024-02-17 DIAGNOSIS — M25472 Effusion, left ankle: Secondary | ICD-10-CM | POA: Diagnosis not present

## 2024-02-18 ENCOUNTER — Other Ambulatory Visit (HOSPITAL_BASED_OUTPATIENT_CLINIC_OR_DEPARTMENT_OTHER): Payer: Self-pay

## 2024-02-18 MED ORDER — LEVOTHYROXINE SODIUM 50 MCG PO TABS
50.0000 ug | ORAL_TABLET | Freq: Every day | ORAL | 3 refills | Status: AC
  Filled 2024-02-18: qty 90, 90d supply, fill #0
  Filled 2024-05-19: qty 90, 90d supply, fill #1

## 2024-02-29 DIAGNOSIS — M76822 Posterior tibial tendinitis, left leg: Secondary | ICD-10-CM | POA: Diagnosis not present

## 2024-02-29 DIAGNOSIS — M25572 Pain in left ankle and joints of left foot: Secondary | ICD-10-CM | POA: Diagnosis not present

## 2024-02-29 DIAGNOSIS — M25472 Effusion, left ankle: Secondary | ICD-10-CM | POA: Diagnosis not present

## 2024-03-01 DIAGNOSIS — M21622 Bunionette of left foot: Secondary | ICD-10-CM | POA: Diagnosis not present

## 2024-03-02 DIAGNOSIS — M25472 Effusion, left ankle: Secondary | ICD-10-CM | POA: Diagnosis not present

## 2024-03-02 DIAGNOSIS — M76822 Posterior tibial tendinitis, left leg: Secondary | ICD-10-CM | POA: Diagnosis not present

## 2024-03-02 DIAGNOSIS — M25572 Pain in left ankle and joints of left foot: Secondary | ICD-10-CM | POA: Diagnosis not present

## 2024-03-07 ENCOUNTER — Other Ambulatory Visit (HOSPITAL_BASED_OUTPATIENT_CLINIC_OR_DEPARTMENT_OTHER): Payer: Self-pay

## 2024-03-07 DIAGNOSIS — M25372 Other instability, left ankle: Secondary | ICD-10-CM | POA: Diagnosis not present

## 2024-03-07 DIAGNOSIS — M205X2 Other deformities of toe(s) (acquired), left foot: Secondary | ICD-10-CM | POA: Diagnosis not present

## 2024-03-07 DIAGNOSIS — M25375 Other instability, left foot: Secondary | ICD-10-CM | POA: Diagnosis not present

## 2024-03-07 DIAGNOSIS — M21622 Bunionette of left foot: Secondary | ICD-10-CM | POA: Diagnosis not present

## 2024-03-07 DIAGNOSIS — Z9889 Other specified postprocedural states: Secondary | ICD-10-CM | POA: Diagnosis not present

## 2024-03-07 MED ORDER — OXYCODONE HCL 5 MG PO TABS
5.0000 mg | ORAL_TABLET | Freq: Four times a day (QID) | ORAL | 0 refills | Status: AC | PRN
  Filled 2024-03-07: qty 12, 3d supply, fill #0

## 2024-03-14 ENCOUNTER — Other Ambulatory Visit (HOSPITAL_BASED_OUTPATIENT_CLINIC_OR_DEPARTMENT_OTHER): Payer: Self-pay

## 2024-03-15 ENCOUNTER — Other Ambulatory Visit (HOSPITAL_BASED_OUTPATIENT_CLINIC_OR_DEPARTMENT_OTHER): Payer: Self-pay

## 2024-03-15 DIAGNOSIS — N390 Urinary tract infection, site not specified: Secondary | ICD-10-CM | POA: Diagnosis not present

## 2024-03-15 MED ORDER — NITROFURANTOIN MONOHYD MACRO 100 MG PO CAPS
100.0000 mg | ORAL_CAPSULE | Freq: Two times a day (BID) | ORAL | 0 refills | Status: AC
  Filled 2024-03-15: qty 14, 7d supply, fill #0

## 2024-04-04 ENCOUNTER — Other Ambulatory Visit (HOSPITAL_BASED_OUTPATIENT_CLINIC_OR_DEPARTMENT_OTHER): Payer: Self-pay

## 2024-04-04 DIAGNOSIS — L821 Other seborrheic keratosis: Secondary | ICD-10-CM | POA: Diagnosis not present

## 2024-04-04 DIAGNOSIS — D225 Melanocytic nevi of trunk: Secondary | ICD-10-CM | POA: Diagnosis not present

## 2024-04-04 DIAGNOSIS — Z85820 Personal history of malignant melanoma of skin: Secondary | ICD-10-CM | POA: Diagnosis not present

## 2024-04-04 DIAGNOSIS — L812 Freckles: Secondary | ICD-10-CM | POA: Diagnosis not present

## 2024-04-04 DIAGNOSIS — L570 Actinic keratosis: Secondary | ICD-10-CM | POA: Diagnosis not present

## 2024-04-04 MED ORDER — HYDROCORTISONE 2.5 % EX CREA
1.0000 | TOPICAL_CREAM | Freq: Two times a day (BID) | CUTANEOUS | 2 refills | Status: AC | PRN
  Filled 2024-04-04: qty 30, 45d supply, fill #0

## 2024-04-07 ENCOUNTER — Other Ambulatory Visit (HOSPITAL_BASED_OUTPATIENT_CLINIC_OR_DEPARTMENT_OTHER): Payer: Self-pay

## 2024-05-19 ENCOUNTER — Other Ambulatory Visit (HOSPITAL_BASED_OUTPATIENT_CLINIC_OR_DEPARTMENT_OTHER): Payer: Self-pay

## 2024-05-24 ENCOUNTER — Ambulatory Visit: Admitting: Podiatry

## 2024-05-24 ENCOUNTER — Ambulatory Visit (INDEPENDENT_AMBULATORY_CARE_PROVIDER_SITE_OTHER)

## 2024-05-24 ENCOUNTER — Ambulatory Visit

## 2024-05-24 ENCOUNTER — Encounter: Payer: Self-pay | Admitting: Podiatry

## 2024-05-24 DIAGNOSIS — Z89422 Acquired absence of other left toe(s): Secondary | ICD-10-CM

## 2024-05-24 DIAGNOSIS — M2041 Other hammer toe(s) (acquired), right foot: Secondary | ICD-10-CM | POA: Diagnosis not present

## 2024-05-24 DIAGNOSIS — B351 Tinea unguium: Secondary | ICD-10-CM

## 2024-05-24 NOTE — Progress Notes (Signed)
 Subjective:   Patient ID: Morgan Gregory, female   DOB: 84 y.o.   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  Also has nails that she cannot control   ROS      Objective:  Physical Exam  Neurovascular status was found to be intact muscle strength was found to be adequate range of motion adequate.  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
